# Patient Record
Sex: Male | Born: 1980 | Race: White | Hispanic: No | Marital: Married | Smoking: Never smoker
Health system: Southern US, Community
[De-identification: ages and names within clinical notes are randomized; demographics above are authoritative.]

## PROBLEM LIST (undated history)

## (undated) DIAGNOSIS — I10 Essential (primary) hypertension: Secondary | ICD-10-CM

## (undated) DIAGNOSIS — Z87898 Personal history of other specified conditions: Secondary | ICD-10-CM

## (undated) DIAGNOSIS — K219 Gastro-esophageal reflux disease without esophagitis: Secondary | ICD-10-CM

## (undated) DIAGNOSIS — R030 Elevated blood-pressure reading, without diagnosis of hypertension: Secondary | ICD-10-CM

## (undated) DIAGNOSIS — F419 Anxiety disorder, unspecified: Secondary | ICD-10-CM

## (undated) HISTORY — DX: Essential (primary) hypertension: I10

## (undated) HISTORY — DX: Personal history of other specified conditions: Z87.898

## (undated) HISTORY — DX: Anxiety disorder, unspecified: F41.9

## (undated) HISTORY — DX: Gastro-esophageal reflux disease without esophagitis: K21.9

## (undated) HISTORY — DX: Elevated blood-pressure reading, without diagnosis of hypertension: R03.0

## (undated) HISTORY — PX: NO PAST SURGERIES: SHX2092

---

## 2005-06-23 ENCOUNTER — Ambulatory Visit: Payer: Self-pay | Admitting: Urology

## 2007-09-26 IMAGING — CT CT ABD-PELV W/O CM
1 of 2 series · 16 of 32 positions shown, 20 images · non-contrast
Comparison: none

REASON FOR EXAM: left renal colic eval kidney stones
COMMENTS:

PROCEDURE:     CT  - CT ABDOMEN AND PELVIS W[DATE]  [DATE]
RESULT:
REASON FOR CONSULTATION: LEFT renal colic.
TECHNIQUE: Axial images were obtained from the hemidiaphragms to the pubic
symphysis without intravenous injection of contrast material.

[Series 2: stone · axial · 0.70mm/px · z∈[-42,+386]mm · 16 of 155 slices shown, 20 images]
[im 6/155  soft-tissue]
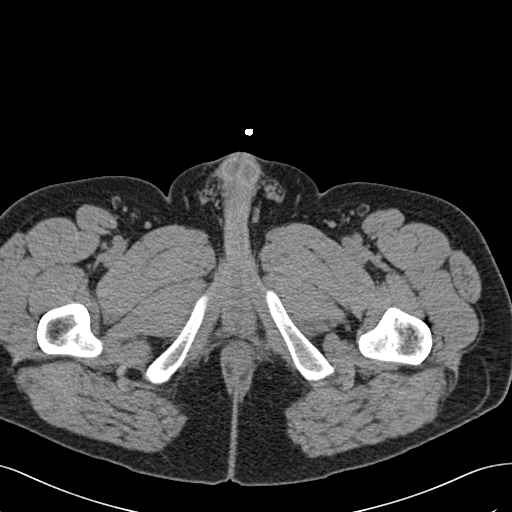
[im 6/155  bone]
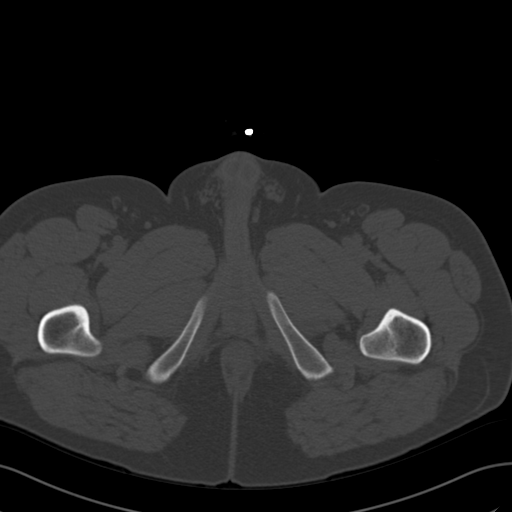
[im 17/155  soft-tissue]
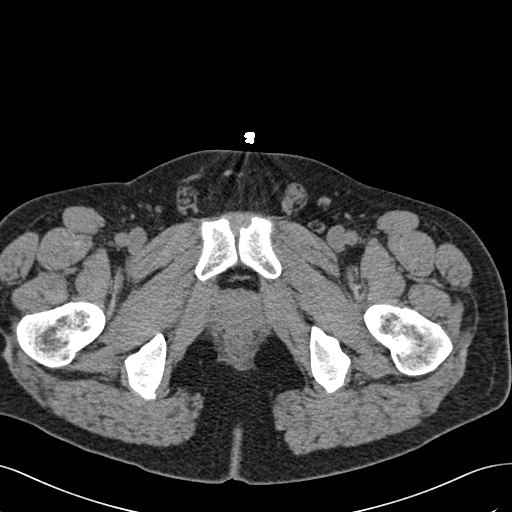
[im 28/155  soft-tissue]
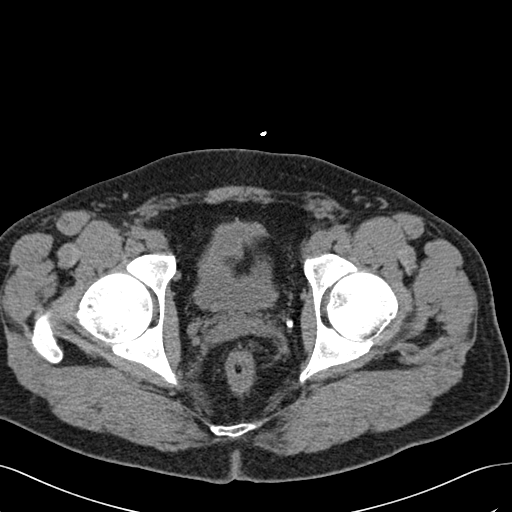
[im 39/155  soft-tissue]
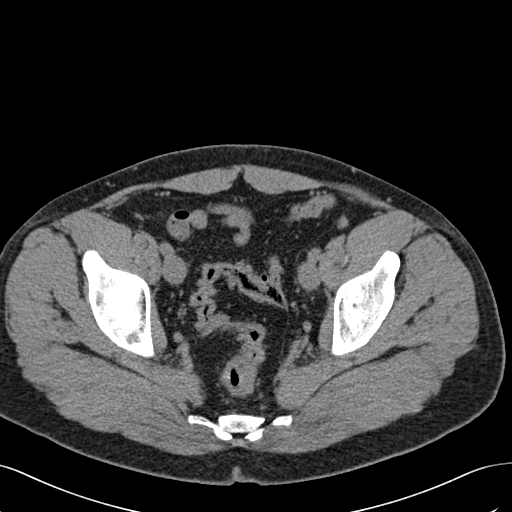
[im 50/155  soft-tissue]
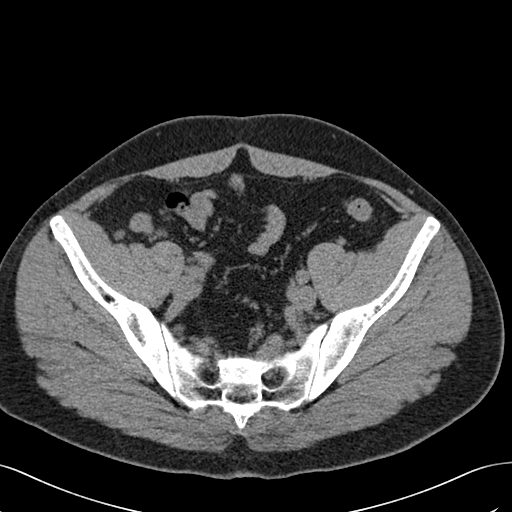
[im 61/155  soft-tissue]
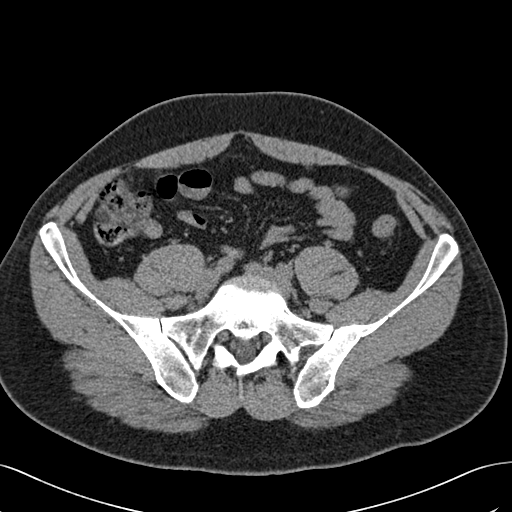
[im 72/155  soft-tissue]
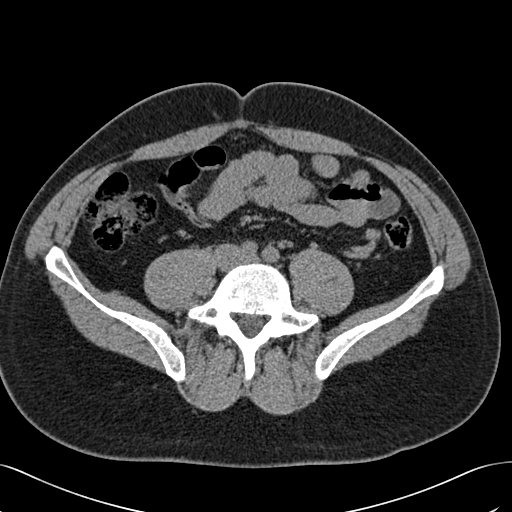
[im 83/155  soft-tissue]
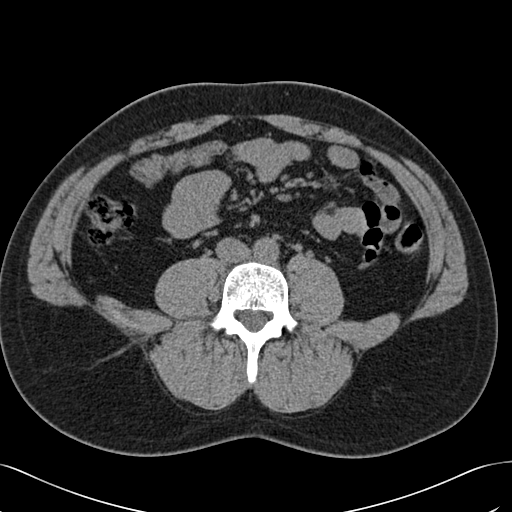
[im 94/155  soft-tissue]
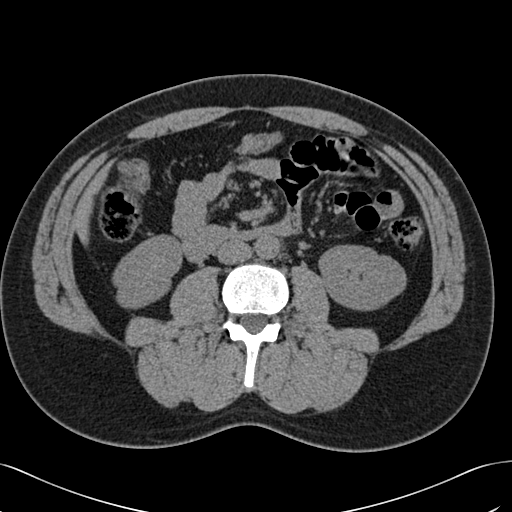
[im 94/155  bone]
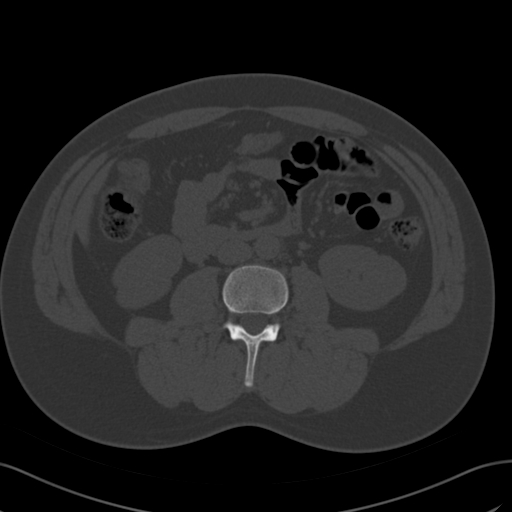
[im 105/155  soft-tissue]
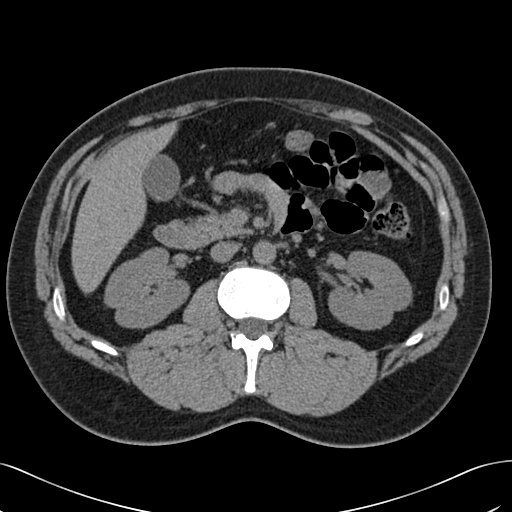
[im 116/155  soft-tissue]
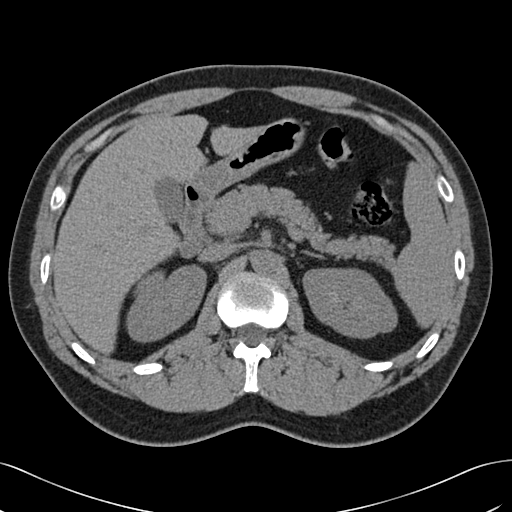
[im 127/155  soft-tissue]
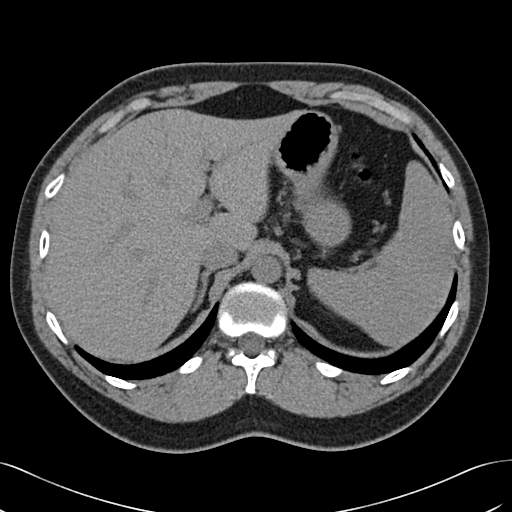
[im 133/155  lung]
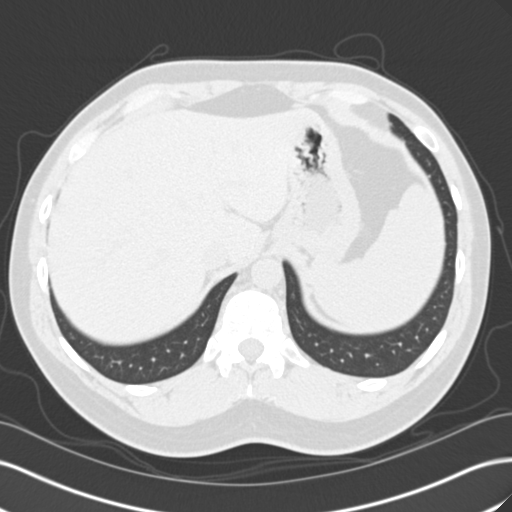
[im 138/155  soft-tissue]
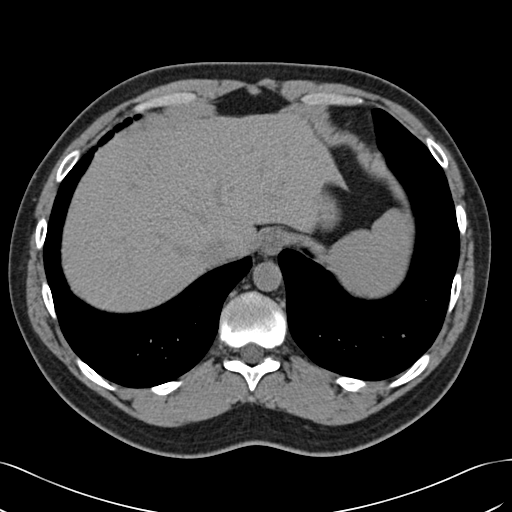
[im 138/155  lung]
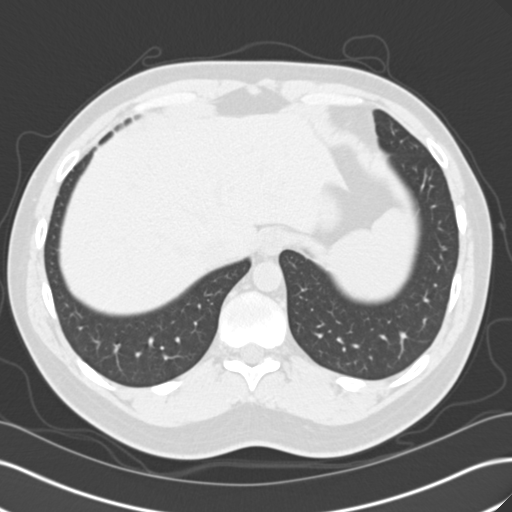
[im 144/155  lung]
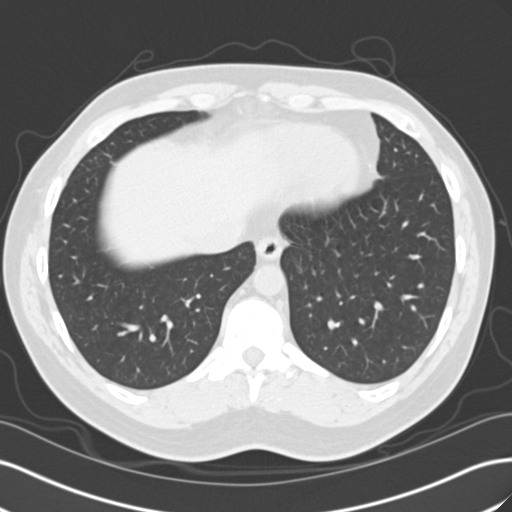
[im 149/155  soft-tissue]
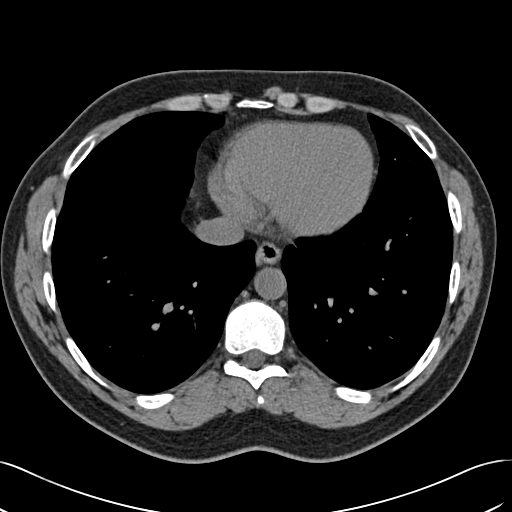
[im 149/155  lung]
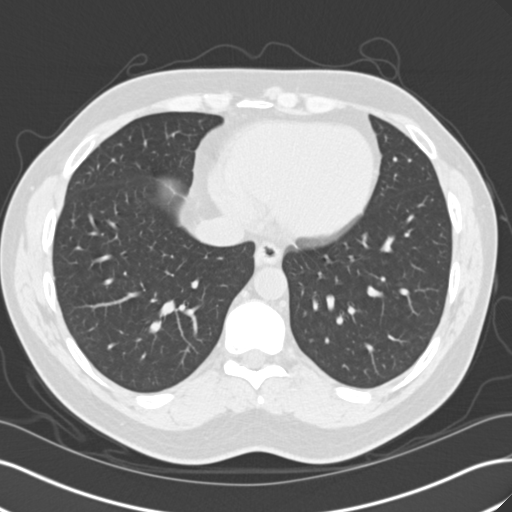

[16 of 32 positions shown; findings below may reference images not displayed]

FINDINGS: No renal or ureteral calculi are identified. No hydronephrosis is
seen.

There is noted a LEFT pelvic phlebolith present.  No fluid is noted in the
pelvis or abdomen. No evidence of diverticulitis or appendicitis is noted.
No major organ abnormalities are noted on the non-contrast study.

The images through the lung bases reveal the lung bases to be clear with no
effusions noted.
IMPRESSION: 1)No renal or ureteral calculi identified. No hydronephrosis is noted.

## 2015-03-24 ENCOUNTER — Encounter: Payer: Self-pay | Admitting: Family Medicine

## 2015-03-24 ENCOUNTER — Ambulatory Visit (INDEPENDENT_AMBULATORY_CARE_PROVIDER_SITE_OTHER): Payer: Managed Care, Other (non HMO) | Admitting: Family Medicine

## 2015-03-24 VITALS — BP 114/68 | HR 72 | Temp 98.3°F | Resp 16 | Ht 74.0 in | Wt 285.4 lb

## 2015-03-24 DIAGNOSIS — R030 Elevated blood-pressure reading, without diagnosis of hypertension: Secondary | ICD-10-CM

## 2015-03-24 DIAGNOSIS — IMO0001 Reserved for inherently not codable concepts without codable children: Secondary | ICD-10-CM | POA: Insufficient documentation

## 2015-03-24 DIAGNOSIS — Z0000 Encounter for general adult medical examination without abnormal findings: Secondary | ICD-10-CM

## 2015-03-24 NOTE — Progress Notes (Signed)
Subjective:     Patient ID: Benjamin Ritter, male   DOB: [birth year], 34 y.o.   MRN: [id number]  HPI  Chief Complaint  Patient presents with  . Annual Exam    Patient is present in office for his annual physical, patient reports that he has no questions or concerns today. Patients last recorded Tdap was within the past 7-8 years, Flu vaccine was given to patient at work in 01/2015  He will bring a foster parent form to fill out later. Continues to work for the [employer] and is "on my feet" most of the day.   Review of Systems General: Feeling well HEENT: regular dental visits. No vision changes or use of corrective lenses. Cardiovascular: no chest pain, shortness of breath, or palpitations GI: no heartburn, no change in bowel habits or blood in the stool GU: nocturia x 0, no change in bladder habits  Psychiatric: not depressed Musculoskeletal: no joint pain     Objective:   Physical Exam  Constitutional: He appears well-developed and well-nourished. No distress.  Eyes: PERRLA, Neck: no thyromegaly, tenderness or nodules,  ENT: TM's intact without inflammation; No tonsillar enlargement or exudate, Lungs: Clear Heart : RRR without murmur or gallop Abd: bowel sounds present, soft, non-tender, no organomegaly Extremities: no edema     Assessment:    1. Annual physical exam - Lipid panel - Comprehensive metabolic panel    Plan:    Further f/u pending lab work.

## 2015-03-24 NOTE — Patient Instructions (Signed)
We will call you with the lab results. 

## 2015-03-26 ENCOUNTER — Telehealth: Payer: Self-pay

## 2015-03-26 ENCOUNTER — Other Ambulatory Visit: Payer: Self-pay | Admitting: Family Medicine

## 2015-03-26 LAB — LIPID PANEL
Chol/HDL Ratio: 6.9 ratio units — ABNORMAL HIGH (ref 0.0–5.0)
Cholesterol, Total: 179 mg/dL (ref 100–199)
HDL: 26 mg/dL — ABNORMAL LOW (ref >39)
Triglycerides: 607 mg/dL (ref 0–149)

## 2015-03-26 LAB — COMPREHENSIVE METABOLIC PANEL
ALBUMIN: 4.5 g/dL (ref 3.5–5.5)
ALK PHOS: 65 IU/L (ref 39–117)
ALT: 23 IU/L (ref 0–44)
AST: 20 IU/L (ref 0–40)
Albumin/Globulin Ratio: 1.7 (ref 1.1–2.5)
BUN/Creatinine Ratio: 14 (ref 8–19)
BUN: 11 mg/dL (ref 6–20)
Bilirubin Total: <0.2 mg/dL (ref 0.0–1.2)
CALCIUM: 9 mg/dL (ref 8.7–10.2)
CO2: 22 mmol/L (ref 18–29)
CREATININE: 0.8 mg/dL (ref 0.76–1.27)
Chloride: 97 mmol/L (ref 97–106)
GFR calc Af Amer: 135 mL/min/{1.73_m2} (ref >59)
GFR, EST NON AFRICAN AMERICAN: 117 mL/min/{1.73_m2} (ref >59)
GLOBULIN, TOTAL: 2.7 g/dL (ref 1.5–4.5)
GLUCOSE: 98 mg/dL (ref 65–99)
Potassium: 4.3 mmol/L (ref 3.5–5.2)
SODIUM: 138 mmol/L (ref 136–144)
Total Protein: 7.2 g/dL (ref 6.0–8.5)

## 2015-03-26 NOTE — Telephone Encounter (Signed)
Patient has been advised of lab report, I have contacted [facility] and awaiting confirmation to see if there is enough specimen left to run lab. KW

## 2015-03-26 NOTE — Telephone Encounter (Signed)
-----   Message from Anola Gurneyobert Chauvin, [place] sent at 03/26/2015  7:48 AM EST ----- Please add direct LDL to lab draw. Let patient know we have to add another lab to calculate his LDL. Other labs are ok.

## 2015-04-17 LAB — SPECIMEN STATUS REPORT

## 2015-04-17 LAB — LDL CHOLESTEROL, DIRECT: LDL DIRECT: 75 mg/dL (ref 0–99)

## 2015-07-06 ENCOUNTER — Telehealth: Payer: Self-pay

## 2015-07-06 ENCOUNTER — Ambulatory Visit (INDEPENDENT_AMBULATORY_CARE_PROVIDER_SITE_OTHER): Payer: Managed Care, Other (non HMO) | Admitting: Family Medicine

## 2015-07-06 ENCOUNTER — Encounter: Payer: Self-pay | Admitting: Family Medicine

## 2015-07-06 VITALS — BP 128/90 | HR 109 | Temp 98.7°F | Resp 17 | Wt 289.4 lb

## 2015-07-06 DIAGNOSIS — R079 Chest pain, unspecified: Secondary | ICD-10-CM

## 2015-07-06 NOTE — Progress Notes (Signed)
Subjective:     Patient ID: Benjamin Ritter, male   DOB: 05/21/1980, 35 y.o.   MRN: [id number]  HPI  Chief Complaint  Patient presents with  . Chest Pain    Patient comes in office today accompanied by his wife with concerns of chest pain intermittent x 2 months or more. Patient reports that he has not noticed any particular activity that would cause chest pain, he described pain as in middle of chest. When patient has episodes of chest pain he complains of difficulty breathing and feeling light headed. Patient reports last episode was lasty night and it occured for an hour  States it feels like a pressure in his upper sternum which radiates down to his lower sternum. Associated with shortness of breath and occasionally lightheaded: "I lift my head and I can breathe better." Not exertion related per his report. Denies burning or use of acid blockers. Episodes last from 15 minutes to an hour (last night). Not usually associated with meals. Paternal hx of a "light" heart attack. Accompanied by his wife today. LDL of 75 on 04/17/15.   Review of Systems     Objective:   Physical Exam  Constitutional: He appears well-developed and well-nourished. No distress.  Cardiovascular: Normal rate and regular rhythm.   Pulmonary/Chest: Breath sounds normal.  Abdominal: Soft. There is no tenderness.       Assessment:    1. Chest pain, unspecified chest pain type - EKG 12-Lead - Ambulatory referral to Cardiology    Plan:    Discussed use of Dexilant 60 mg.and aspirin daily.

## 2015-07-06 NOTE — Telephone Encounter (Signed)
Patient's wife is calling saying that patient has had a discomfort in his chest for the last 2-3 weeks that comes and goes. She reports that his symptoms are very inconsistent, and it's not associated with activity. She reports that his last episode last night he had severe shortness of breath. He does become dizzy at times. However, this only last a few seconds. She reports that patient does not have history of heart disease. He does not currently take anything on a daily basis, and has not been taking anything for the pain. Appt was scheduled for today at 4:30 to be evaluated. Advised wife if symptoms worsen, he should go to the ER. She verbalized understanding.

## 2015-07-06 NOTE — Patient Instructions (Addendum)
Start Dexilant samples one daily and 81 mg.aspirin daily

## 2016-02-24 ENCOUNTER — Ambulatory Visit (INDEPENDENT_AMBULATORY_CARE_PROVIDER_SITE_OTHER): Payer: Managed Care, Other (non HMO) | Admitting: Physician Assistant

## 2016-02-24 ENCOUNTER — Encounter: Payer: Self-pay | Admitting: Physician Assistant

## 2016-02-24 VITALS — BP 126/88 | HR 96 | Temp 98.2°F | Resp 16 | Wt 288.0 lb

## 2016-02-24 DIAGNOSIS — J029 Acute pharyngitis, unspecified: Secondary | ICD-10-CM | POA: Diagnosis not present

## 2016-02-24 LAB — POCT RAPID STREP A (OFFICE): Rapid Strep A Screen: NEGATIVE

## 2016-02-24 MED ORDER — AMOXICILLIN 500 MG PO CAPS: 500.0000 mg | ORAL_CAPSULE | Freq: Two times a day (BID) | ORAL | 0 refills | Status: AC

## 2016-02-24 NOTE — Progress Notes (Signed)
Patient: Benjamin Ritter Male    DOB: 02-25-1981   35 y.o.   MRN: [id number] Visit Date: 02/24/2016  Today's Provider: Trey SailorsAdriana M Pollak, PA-C   Chief Complaint  Patient presents with  . Sore Throat    Started Saturday   Subjective:    Sore Throat   This is a new problem. The current episode started in the past 7 days. The problem has been gradually worsening. The pain is worse on the left side. Associated symptoms include congestion, coughing, headaches and trouble swallowing. Pertinent negatives include no ear discharge, ear pain, neck pain, shortness of breath or stridor. He has had exposure to strep. Exposure to: His children have strep.   Patient has three children with strep. Also having sinus pain/pressure, ear pressure.  No Known Allergies  No current outpatient prescriptions on file.  Review of Systems  Constitutional: Positive for chills and fatigue. Negative for activity change, appetite change, diaphoresis, fever and unexpected weight change.  HENT: Positive for congestion, postnasal drip, rhinorrhea, sinus pain, sinus pressure, sneezing, sore throat, trouble swallowing and voice change. Negative for ear discharge, ear pain, nosebleeds and tinnitus.   Eyes: Positive for discharge and itching. Negative for photophobia, pain, redness and visual disturbance.  Respiratory: Positive for cough. Negative for apnea, choking, chest tightness, shortness of breath, wheezing and stridor.   Cardiovascular: Negative.   Gastrointestinal: Negative.   Musculoskeletal: Negative for neck pain and neck stiffness.  Neurological: Positive for headaches. Negative for dizziness and light-headedness.    Social History  Substance Use Topics  . Smoking status: Never Smoker  . Smokeless tobacco: Never Used  . Alcohol use Yes   Objective:   BP 126/88 (BP Location: Left Arm, Patient Position: Sitting, Cuff Size: Large)   Pulse 96   Temp 98.2 F (36.8 C) (Oral)   Resp 16   Wt 288 lb  (130.6 kg)   BMI 36.98 kg/m   Physical Exam  Constitutional: He is oriented to person, place, and time. He appears well-developed and well-nourished. No distress.  HENT:  Right Ear: External ear normal.  Left Ear: External ear normal.  Nose: Nose normal.  Mouth/Throat: Uvula is midline and mucous membranes are normal. Oropharyngeal exudate, posterior oropharyngeal edema and posterior oropharyngeal erythema present.  Eyes: Conjunctivae are normal.  Cardiovascular: Normal rate and regular rhythm.   Pulmonary/Chest: Effort normal and breath sounds normal. No respiratory distress. He has no wheezes.  Lymphadenopathy:    He has cervical adenopathy.  Neurological: He is alert and oriented to person, place, and time.  Skin: Skin is warm and dry.  Psychiatric: He has a normal mood and affect. His behavior is normal.  Vitals reviewed.       Assessment & Plan:      Problem List Items Addressed This Visit    None    Visit Diagnoses    Pharyngitis, unspecified etiology    -  Primary   Relevant Medications   amoxicillin (AMOXIL) 500 MG capsule     Patient is 35 y/o presenting with pharyngitis in context of strep exposure, concerning for strep pharyngitis. Rapid strep test in office negative. Will treat as above since significant source of exposure. Patient to call back if not feeling better. Work note provided.  No Follow-up on file.  The entirety of the information documented in the History of Present Illness, Review of Systems and Physical Exam were personally obtained by me. Portions of this information were initially documented by  Kavin LeechLaura Ravinder Lukehart, CMA and reviewed by me for thoroughness and accuracy.    Patient Instructions  Strep Throat Strep throat is a bacterial infection of the throat. Your health care provider may call the infection tonsillitis or pharyngitis, depending on whether there is swelling in the tonsils or at the back of the throat. Strep throat is most common during the  cold months of the year in children who are 685-35 years of age, but it can happen during any season in people of any age. This infection is spread from person to person (contagious) through coughing, sneezing, or close contact. CAUSES Strep throat is caused by the bacteria called Streptococcus pyogenes. RISK FACTORS This condition is more likely to develop in:  People who spend time in crowded places where the infection can spread easily.  People who have close contact with someone who has strep throat. SYMPTOMS Symptoms of this condition include:  Fever or chills.   Redness, swelling, or pain in the tonsils or throat.  Pain or difficulty when swallowing.  White or yellow spots on the tonsils or throat.  Swollen, tender glands in the neck or under the jaw.  Red rash all over the body (rare). DIAGNOSIS This condition is diagnosed by performing a rapid strep test or by taking a swab of your throat (throat culture test). Results from a rapid strep test are usually ready in a few minutes, but throat culture test results are available after one or two days. TREATMENT This condition is treated with antibiotic medicine. HOME CARE INSTRUCTIONS Medicines  Take over-the-counter and prescription medicines only as told by your health care provider.  Take your antibiotic as told by your health care provider. Do not stop taking the antibiotic even if you start to feel better.  Have family members who also have a sore throat or fever tested for strep throat. They may need antibiotics if they have the strep infection. Eating and Drinking  Do not share food, drinking cups, or personal items that could cause the infection to spread to other people.  If swallowing is difficult, try eating soft foods until your sore throat feels better.  Drink enough fluid to keep your urine clear or pale yellow. General Instructions  Gargle with a salt-water mixture 3-4 times per day or as needed. To make a  salt-water mixture, completely dissolve -1 tsp of salt in 1 cup of warm water.  Make sure that all household members wash their hands well.  Get plenty of rest.  Stay home from school or work until you have been taking antibiotics for 24 hours.  Keep all follow-up visits as told by your health care provider. This is important. SEEK MEDICAL CARE IF:  The glands in your neck continue to get bigger.  You develop a rash, cough, or earache.  You cough up a thick liquid that is green, yellow-brown, or bloody.  You have pain or discomfort that does not get better with medicine.  Your problems seem to be getting worse rather than better.  You have a fever. SEEK IMMEDIATE MEDICAL CARE IF:  You have new symptoms, such as vomiting, severe headache, stiff or painful neck, chest pain, or shortness of breath.  You have severe throat pain, drooling, or changes in your voice.  You have swelling of the neck, or the skin on the neck becomes red and tender.  You have signs of dehydration, such as fatigue, dry mouth, and decreased urination.  You become increasingly sleepy, or you cannot wake  up completely.  Your joints become red or painful.   This information is not intended to replace advice given to you by your health care provider. Make sure you discuss any questions you have with your health care provider.   Document Released: 04/01/2000 Document Revised: 12/24/2014 Document Reviewed: 07/28/2014 Elsevier Interactive Patient Education 2016 [employer].           Trey Sailors, PA-C  [facility]

## 2016-02-24 NOTE — Patient Instructions (Signed)

## 2016-02-24 NOTE — Addendum Note (Signed)
Addended by: Kavin LeechWALSH, Marke Goodwyn E on: 02/24/2016 02:09 PM   Modules accepted: Orders

## 2016-05-23 ENCOUNTER — Ambulatory Visit (INDEPENDENT_AMBULATORY_CARE_PROVIDER_SITE_OTHER): Payer: Managed Care, Other (non HMO) | Admitting: Family Medicine

## 2016-05-23 ENCOUNTER — Encounter: Payer: Self-pay | Admitting: Family Medicine

## 2016-05-23 VITALS — BP 112/80 | HR 115 | Temp 99.0°F | Resp 16 | Ht 74.0 in | Wt 286.0 lb

## 2016-05-23 DIAGNOSIS — E669 Obesity, unspecified: Secondary | ICD-10-CM | POA: Diagnosis not present

## 2016-05-23 DIAGNOSIS — Z23 Encounter for immunization: Secondary | ICD-10-CM | POA: Diagnosis not present

## 2016-05-23 DIAGNOSIS — Z0000 Encounter for general adult medical examination without abnormal findings: Secondary | ICD-10-CM

## 2016-05-23 NOTE — Patient Instructions (Signed)
We will call you about the lab results and referral to nutritionist.

## 2016-05-23 NOTE — Progress Notes (Signed)
Subjective:     Patient ID: Benjamin Ritter, male   DOB: 1981-01-29, 36 y.o.   MRN: [id number]  HPI  Chief Complaint  Patient presents with  . Annual Exam    Patient comes in office today for his annual physical patient would like to adddress today his weight. Patient reports that he is trying to follow a balanced diet, patient is not actively exercising but states that he stays busy with work duties, he states that he sleeps between 6-7hrs at night. Patient declined flu vaccine and since there is no record of Tdap patient has agreed to update it today.   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 Has cut down on sodas and is trying to eat grilled not fried food. States he likes sweet tea. Attributes his weight to overeating/snacking.   Review of Systems General: Feeling well HEENT: regular dental visits, no corrective lenses Cardiovascular: no chest pain, shortness of breath, or palpitations GI: occasional heartburn for which he takes Tums;no change in bowel habits or blood in the stool GU: nocturia x 0, no change in bladder habits  Psychiatric: not depressed Musculoskeletal: no joint pain    Objective:   Physical Exam  Constitutional: He appears well-developed and well-nourished. No distress.  Eyes: PERRLA,  Neck: no thyromegaly, tenderness or nodules,no cervical adenopathy  ENT: TM's intact without inflammation; No tonsillar enlargement or exudate, Lungs: Clear Heart : RRR without murmur or gallop Abd: bowel sounds present, soft, non-tender, no organomegaly Extremities: no edema     Assessment:    1. Annual physical exam - Comprehensive metabolic panel - Lipid panel  2. Need for diphtheria-tetanus-pertussis (Tdap) vaccine - Tdap vaccine greater than or equal to 7yo IM  3. Obesity (BMI 30-39.9) - Amb ref to Medical Nutrition Therapy-MNT    Plan:    Further pending lab work Consider appetite suppressants.

## 2016-05-25 LAB — LIPID PANEL
CHOLESTEROL TOTAL: 154 mg/dL (ref 100–199)
Chol/HDL Ratio: 4.7 ratio units (ref 0.0–5.0)
HDL: 33 mg/dL — AB (ref >39)
LDL Calculated: 84 mg/dL (ref 0–99)
Triglycerides: 187 mg/dL — ABNORMAL HIGH (ref 0–149)
VLDL Cholesterol Cal: 37 mg/dL (ref 5–40)

## 2016-05-25 LAB — COMPREHENSIVE METABOLIC PANEL
A/G RATIO: 1.6 (ref 1.2–2.2)
ALK PHOS: 63 IU/L (ref 39–117)
ALT: 17 IU/L (ref 0–44)
AST: 17 IU/L (ref 0–40)
Albumin: 4.6 g/dL (ref 3.5–5.5)
BILIRUBIN TOTAL: 0.5 mg/dL (ref 0.0–1.2)
BUN/Creatinine Ratio: 16 (ref 9–20)
BUN: 11 mg/dL (ref 6–20)
CHLORIDE: 99 mmol/L (ref 96–106)
CO2: 23 mmol/L (ref 18–29)
Calcium: 9.2 mg/dL (ref 8.7–10.2)
Creatinine, Ser: 0.68 mg/dL — ABNORMAL LOW (ref 0.76–1.27)
GFR calc non Af Amer: 124 mL/min/{1.73_m2} (ref >59)
GFR, EST AFRICAN AMERICAN: 143 mL/min/{1.73_m2} (ref >59)
Globulin, Total: 2.9 g/dL (ref 1.5–4.5)
Glucose: 102 mg/dL — ABNORMAL HIGH (ref 65–99)
POTASSIUM: 4.3 mmol/L (ref 3.5–5.2)
Sodium: 139 mmol/L (ref 134–144)
Total Protein: 7.5 g/dL (ref 6.0–8.5)

## 2017-07-20 ENCOUNTER — Ambulatory Visit (INDEPENDENT_AMBULATORY_CARE_PROVIDER_SITE_OTHER): Payer: PRIVATE HEALTH INSURANCE | Admitting: Family Medicine

## 2017-07-20 ENCOUNTER — Encounter: Payer: Self-pay | Admitting: Family Medicine

## 2017-07-20 VITALS — BP 116/86 | HR 77 | Temp 98.4°F | Resp 16 | Ht 75.0 in | Wt 287.8 lb

## 2017-07-20 DIAGNOSIS — Z0000 Encounter for general adult medical examination without abnormal findings: Secondary | ICD-10-CM

## 2017-07-20 NOTE — Progress Notes (Signed)
Subjective:     Patient ID: Benjamin Ritter, male   DOB: [birth year], 37 y.o.   MRN: [id number] Chief Complaint  Patient presents with  . Annual Exam    Patient comes in office today for his annual physical in general patient states that he feels well today and has no issues to [employer]. Patient is following a well balanced diet, staying active daily with work duties, and is sleeping on average 6hrs a night. Patients last reported Tdap was 05/23/16, patient is due for screening labs today.    HPI Continues to work for the [employer]. States he walks with his family 30 minutes daily. Did not see a nutritionist last year as not covered by his insurance. Reports getting a dermatology screen last year (Dr. Gwen PoundsKowalski).  Review of Systems General: Feeling well HEENT: regular dental visits and eye exam last year (no corrective lenses) Cardiovascular: no chest pain, shortness of breath, or palpitations GI: no heartburn, no change in bowel habits or blood in the stool GU: nocturia x 0, no change in bladder habits  Psychiatric: not depressed Musculoskeletal: no joint pain    Objective:   Physical Exam  Constitutional: He appears well-developed and well-nourished. No distress.  Eyes: PERRLA, Neck: no thyromegaly, tenderness or nodules, no cervical adenopathy ENT: TM's intact without inflammation; No tonsillar enlargement or exudate, Lungs: Clear Heart : RRR without murmur or gallop Abd: bowel sounds present, soft, non-tender, no organomegaly Extremities: no edema Skin: no atypical lesions noted on his back     Assessment:    1. Annual physical exam - Comprehensive metabolic panel - Lipid panel    Plan:    Further f/u pending lab results. Encourage his walking program.

## 2017-07-20 NOTE — Patient Instructions (Signed)
We will call you with the lab results. Continue with walking program at least 30 minutes daily.

## 2017-07-21 ENCOUNTER — Encounter: Payer: Managed Care, Other (non HMO) | Admitting: Family Medicine

## 2017-07-22 LAB — COMPREHENSIVE METABOLIC PANEL
A/G RATIO: 1.5 (ref 1.2–2.2)
ALBUMIN: 4.4 g/dL (ref 3.5–5.5)
ALT: 19 IU/L (ref 0–44)
AST: 16 IU/L (ref 0–40)
Alkaline Phosphatase: 58 IU/L (ref 39–117)
BUN/Creatinine Ratio: 13 (ref 9–20)
BUN: 10 mg/dL (ref 6–20)
Bilirubin Total: 0.3 mg/dL (ref 0.0–1.2)
CALCIUM: 9 mg/dL (ref 8.7–10.2)
CO2: 21 mmol/L (ref 20–29)
Chloride: 100 mmol/L (ref 96–106)
Creatinine, Ser: 0.76 mg/dL (ref 0.76–1.27)
GFR, EST AFRICAN AMERICAN: 136 mL/min/{1.73_m2} (ref >59)
GFR, EST NON AFRICAN AMERICAN: 117 mL/min/{1.73_m2} (ref >59)
Globulin, Total: 2.9 g/dL (ref 1.5–4.5)
Glucose: 97 mg/dL (ref 65–99)
POTASSIUM: 4.5 mmol/L (ref 3.5–5.2)
Sodium: 139 mmol/L (ref 134–144)
TOTAL PROTEIN: 7.3 g/dL (ref 6.0–8.5)

## 2017-07-22 LAB — LIPID PANEL
CHOL/HDL RATIO: 5.4 ratio — AB (ref 0.0–5.0)
Cholesterol, Total: 158 mg/dL (ref 100–199)
HDL: 29 mg/dL — AB (ref >39)
LDL Calculated: 64 mg/dL (ref 0–99)
Triglycerides: 325 mg/dL — ABNORMAL HIGH (ref 0–149)
VLDL CHOLESTEROL CAL: 65 mg/dL — AB (ref 5–40)

## 2017-07-24 ENCOUNTER — Telehealth: Payer: Self-pay

## 2017-07-24 NOTE — Telephone Encounter (Signed)
Patient advised.KW 

## 2017-07-24 NOTE — Telephone Encounter (Signed)
-----   Message from Anola Gurneyobert Chauvin, [place] sent at 07/24/2017  8:07 AM EDT ----- Labs are ok. Mild elevation of triglycerides may improved with minimizing low fat food choices. Low HDL (good cholesterol) may improved with regular exercise.

## 2017-12-19 ENCOUNTER — Ambulatory Visit: Payer: PRIVATE HEALTH INSURANCE | Admitting: Family Medicine

## 2017-12-19 ENCOUNTER — Encounter: Payer: Self-pay | Admitting: Family Medicine

## 2017-12-19 VITALS — BP 112/80 | HR 84 | Temp 98.8°F | Resp 16 | Wt 285.6 lb

## 2017-12-19 DIAGNOSIS — J0120 Acute ethmoidal sinusitis, unspecified: Secondary | ICD-10-CM

## 2017-12-19 MED ORDER — AMOXICILLIN-POT CLAVULANATE 875-125 MG PO TABS: 1.0000 | ORAL_TABLET | Freq: Two times a day (BID) | ORAL | 0 refills | Status: DC

## 2017-12-19 MED ORDER — HYDROCODONE-HOMATROPINE 5-1.5 MG/5ML PO SYRP: 5.0000 mL | ORAL_SOLUTION | Freq: Four times a day (QID) | ORAL | 0 refills | Status: AC | PRN

## 2017-12-19 MED ORDER — HYDROCODONE-HOMATROPINE 5-1.5 MG/5ML PO SYRP: 5.0000 mL | ORAL_SOLUTION | Freq: Four times a day (QID) | ORAL | 0 refills | Status: DC | PRN

## 2017-12-19 NOTE — Patient Instructions (Signed)
Add Mucinex D for congestion. Let me now if sinuses are not clearing over the course of the week.

## 2017-12-19 NOTE — Progress Notes (Signed)
  Subjective:     Patient ID: Benjamin Ritter, male   DOB: 1980/11/08, 37 y.o.   MRN: [id number] Chief Complaint  Patient presents with  . Sinus Problem    Patient comes in office today with complaints of sinus pain and pressure for the past 10 days. Patient reports cough, sore throat, ear pain,shortness of breath and wheezing. Patient has taken otc Ibuprofen   HPI Patient reports increased sinus pressure, purulent sinus drainage, post nasal drainage and accompanying cough  Review of Systems     Objective:   Physical Exam  Constitutional: He appears well-developed. No distress.  Ears: T.M's intact without inflammation Sinuses: mild paranasal sinus tenderness Throat: mild tonsillar enlargement without erythema or exudate Neck: no cervical adenopathy Lungs: clear     Assessment:    1. Acute non-recurrent ethmoidal sinusitis - amoxicillin-clavulanate (AUGMENTIN) 875-125 MG tablet; Take 1 tablet by mouth 2 (two) times daily.  Dispense: 20 tablet; Refill: 0 - HYDROcodone-homatropine (HYCODAN) 5-1.5 MG/5ML syrup; Take 5 mLs by mouth every 6 (six) hours as needed for up to 5 days. 5 ml 4-6 hours as needed for cough  Dispense: 100 mL; Refill: 0    Plan:    Discussed use of Mucinex D for congestion. To call if not improving over the course of the week.

## 2018-03-20 ENCOUNTER — Encounter: Payer: Self-pay | Admitting: Podiatry

## 2018-03-20 ENCOUNTER — Other Ambulatory Visit: Payer: Self-pay | Admitting: Podiatry

## 2018-03-20 ENCOUNTER — Ambulatory Visit: Payer: PRIVATE HEALTH INSURANCE | Admitting: Podiatry

## 2018-03-20 ENCOUNTER — Ambulatory Visit (INDEPENDENT_AMBULATORY_CARE_PROVIDER_SITE_OTHER): Payer: PRIVATE HEALTH INSURANCE

## 2018-03-20 ENCOUNTER — Ambulatory Visit: Payer: PRIVATE HEALTH INSURANCE

## 2018-03-20 DIAGNOSIS — M722 Plantar fascial fibromatosis: Secondary | ICD-10-CM

## 2018-03-20 DIAGNOSIS — M7750 Other enthesopathy of unspecified foot: Secondary | ICD-10-CM

## 2018-03-20 DIAGNOSIS — M779 Enthesopathy, unspecified: Secondary | ICD-10-CM | POA: Diagnosis not present

## 2018-03-20 DIAGNOSIS — M778 Other enthesopathies, not elsewhere classified: Secondary | ICD-10-CM

## 2018-03-20 MED ORDER — MELOXICAM 15 MG PO TABS: 15.0000 mg | ORAL_TABLET | Freq: Every day | ORAL | 3 refills | Status: DC

## 2018-03-20 MED ORDER — METHYLPREDNISOLONE 4 MG PO TBPK: ORAL_TABLET | ORAL | 0 refills | Status: DC

## 2018-03-20 NOTE — Progress Notes (Signed)
  Subjective:  Patient ID: Benjamin Ritter, male    DOB: 01/25/1981,  MRN: [id number] HPI Chief Complaint  Patient presents with  . Foot Pain    Patient presents today for bilat foot pain under 5th metatarsals and heels x 2-3 months.  He states "it feels like bones rubbing together and sharp pains that come and goes.  He has been taking Ibuprofen and using Deep blue gel with  no relief    37 y.o. male presents with the above complaint.   ROS: Denies fever chills nausea vomiting muscle aches pains calf pain back pain chest pain shortness of breath.  Past Medical History:  Diagnosis Date  . H/O heartburn   . Transient elevated blood pressure    Past Surgical History:  Procedure Laterality Date  . NO PAST SURGERIES      Current Outpatient Medications:  .  ibuprofen (ADVIL,MOTRIN) 200 MG tablet, Take 200 mg by mouth every 6 (six) hours as needed., Disp: , Rfl:  .  meloxicam (MOBIC) 15 MG tablet, Take 1 tablet (15 mg total) by mouth daily., Disp: 30 tablet, Rfl: 3 .  methylPREDNISolone (MEDROL DOSEPAK) 4 MG TBPK tablet, 6 day dose pack - take as directed, Disp: 21 tablet, Rfl: 0  No Known Allergies Review of Systems Objective:  There were no vitals filed for this visit.  General: Well developed, nourished, in no acute distress, alert and oriented x3   Dermatological: Skin is warm, dry and supple bilateral. Nails x 10 are well maintained; remaining integument appears unremarkable at this time. There are no open sores, no preulcerative lesions, no rash or signs of infection present.  Vascular: Dorsalis Pedis artery and Posterior Tibial artery pedal pulses are 2/4 bilateral with immedate capillary fill time. Pedal hair growth present. No varicosities and no lower extremity edema present bilateral.   Neruologic: Grossly intact via light touch bilateral. Vibratory intact via tuning fork bilateral. Protective threshold with Semmes Wienstein monofilament intact to all pedal sites bilateral.  Patellar and Achilles deep tendon reflexes 2+ bilateral. No Babinski or clonus noted bilateral.   Musculoskeletal: No gross boney pedal deformities bilateral. No pain, crepitus, or limitation noted with foot and ankle range of motion bilateral. Muscular strength 5/5 in all groups tested bilateral.  Pain on palpation plantar to the fifth metatarsal right.  Mild tenderness on palpation of the left heel. Gait: Unassisted, Nonantalgic.    Radiographs:  Radiographs taken today demonstrate mild pes planus with soft tissue increase in density plantar fashion calcaneal insertion site of the left heel and some soft tissue swelling of the forefoot right.  No fractures identified.  Assessment & Plan:   Assessment: Capsulitis neuritis fasciitis lateral aspect right fifth met plantarly.  Plantar fasciitis left.  Plan: After sterile Betadine skin prep I injected 20 mg Kenalog 5 mg Marcaine point maximal tenderness of the fifth metatarsal of the right foot.  Tolerated procedure well states that it felt better already.  Start him on a Medrol Dosepak to be followed by meloxicam.  Discussed appropriate shoe gear stretching exercise ice therapy sugar modifications discussed possible need for orthotics.     Ballard Budney T. [place], [place]

## 2018-04-17 ENCOUNTER — Telehealth: Payer: Self-pay

## 2018-04-17 ENCOUNTER — Encounter: Payer: Self-pay | Admitting: Family Medicine

## 2018-04-17 ENCOUNTER — Ambulatory Visit (INDEPENDENT_AMBULATORY_CARE_PROVIDER_SITE_OTHER): Payer: PRIVATE HEALTH INSURANCE | Admitting: Family Medicine

## 2018-04-17 ENCOUNTER — Other Ambulatory Visit: Payer: Self-pay

## 2018-04-17 VITALS — BP 136/84 | HR 92 | Temp 98.3°F | Ht 74.0 in | Wt 289.8 lb

## 2018-04-17 DIAGNOSIS — B349 Viral infection, unspecified: Secondary | ICD-10-CM

## 2018-04-17 LAB — POC INFLUENZA A&B (BINAX/QUICKVUE)
INFLUENZA B, POC: NEGATIVE
Influenza A, POC: NEGATIVE

## 2018-04-17 NOTE — Telephone Encounter (Signed)
Patient's wife calling in this morning for an appt for Benjamin Ritter. Patient is having fever, chills and body aches. Please advise. CB# [phone]

## 2018-04-17 NOTE — Progress Notes (Signed)
  Subjective:     Patient ID: Benjamin Ritter, male   DOB: September 25, 1980, 37 y.o.   MRN: [id number] Chief Complaint  Patient presents with  . flu like symptoms    fever of 100.0 this morning, body aches, headache since yesterday afternoon 04/16/18   HPI Also reports sinus congestion and cough. + flu shot.  Review of Systems     Objective:   Physical Exam Constitutional:      General: He is not in acute distress.    Appearance: He is ill-appearing.  Neurological:     Mental Status: He is alert.   Ears: T.M's intact without inflammation Sinuses: non-tender Throat: no tonsillar enlargement or exudate Neck: no cervical adenopathy Lungs: clear     Assessment:    1. Acute viral syndrome - POC Influenza A&B(BINAX/QUICKVUE)    Plan:    Discussed use of Mucinex D for congestion, Delsym for cough, and Benadryl for postnasal drainage. Work excuse for 12/31-04/20/17

## 2018-04-17 NOTE — Telephone Encounter (Signed)
Please review

## 2018-04-17 NOTE — Telephone Encounter (Signed)
He may be put in the per doctor slot at 10:40.

## 2018-04-17 NOTE — Patient Instructions (Signed)
Discussed use of Mucinex D for congestion, Delsym for cough, and Benadryl for postnasal drainage 

## 2018-04-23 ENCOUNTER — Ambulatory Visit: Payer: PRIVATE HEALTH INSURANCE | Admitting: Podiatry

## 2018-06-15 ENCOUNTER — Encounter: Payer: Self-pay | Admitting: Family Medicine

## 2018-06-15 ENCOUNTER — Ambulatory Visit (INDEPENDENT_AMBULATORY_CARE_PROVIDER_SITE_OTHER): Payer: PRIVATE HEALTH INSURANCE | Admitting: Family Medicine

## 2018-06-15 VITALS — BP 126/86 | HR 103 | Temp 98.3°F | Resp 16 | Wt 291.8 lb

## 2018-06-15 DIAGNOSIS — Z029 Encounter for administrative examinations, unspecified: Secondary | ICD-10-CM

## 2018-06-15 NOTE — Patient Instructions (Signed)
Best wishes for the future.

## 2018-06-15 NOTE — Progress Notes (Signed)
  Subjective:     Patient ID: Benjamin Ritter, male   DOB: 1980/08/17, 38 y.o.   MRN: [id number] Chief Complaint  Patient presents with  . Medical Clearance    Patient comes in office today for clearance for adoption/foster care, patient reports that he is feeling well today but has some back pain.    HPI He is two weeks out from a workman's comp back injury but is recovering. Low risk for TB and feels well today. No chronic medical or mental health issues.  Review of Systems     Objective:   Physical Exam Constitutional:      General: He is not in acute distress.    Appearance: He is not ill-appearing.  Neurological:     Mental Status: He is alert.  Psychiatric:        Mood and Affect: Mood normal.        Behavior: Behavior normal.        Assessment:    1. Administrative encounter    Plan:    Foster care clearance form completed.

## 2019-04-01 ENCOUNTER — Encounter: Payer: Self-pay | Admitting: Podiatry

## 2019-04-01 ENCOUNTER — Ambulatory Visit: Payer: PRIVATE HEALTH INSURANCE | Admitting: Podiatry

## 2019-04-01 ENCOUNTER — Other Ambulatory Visit: Payer: Self-pay

## 2019-04-01 DIAGNOSIS — M722 Plantar fascial fibromatosis: Secondary | ICD-10-CM | POA: Diagnosis not present

## 2019-04-01 MED ORDER — METHYLPREDNISOLONE 4 MG PO TBPK: ORAL_TABLET | ORAL | 0 refills | Status: DC

## 2019-04-01 MED ORDER — MELOXICAM 15 MG PO TABS: 15.0000 mg | ORAL_TABLET | Freq: Every day | ORAL | 3 refills | Status: DC

## 2019-04-01 NOTE — Progress Notes (Signed)
He presents today as a city of [employer] with a chief complaint of heel pain to the right times the past several weeks.  States he has been doing very good with the left foot since last time I saw him.  He states that his foot is starting to bother me again like he did over here.  Denies any trauma.  Denies any changes in his past medical history medications or allergies.  ROS: Denies fever chills nausea vomiting muscle aches pains calf pain back pain chest pain shortness of breath.  Objective: Vital signs are stable alert and oriented x3 pulses are palpable.  Neurologic sensorium is intact deep tendon reflexes are intact muscle strength is normal symmetrical.  He has pain on palpation medial calcaneal tubercle of the right heel.  Assessment: Plan fasciitis right.  Plan: Discussed etiology pathology conservative surgical therapy starting bilateral Dosepak to be followed by meloxicam.  I injected her right heel today with 20 mg Kenalog 5 mg Marcaine point of maximal tenderness.  Tolerated procedure well without complications.  Started him on a Medrol Dosepak to be followed by meloxicam.  Discussed appropriate shoe gear stretching exercise ice therapy shoe gear modifications.  Follow-up with him on an as-needed basis.

## 2019-04-15 ENCOUNTER — Ambulatory Visit: Payer: PRIVATE HEALTH INSURANCE | Attending: Internal Medicine

## 2019-04-15 DIAGNOSIS — Z20822 Contact with and (suspected) exposure to covid-19: Secondary | ICD-10-CM

## 2019-04-17 LAB — NOVEL CORONAVIRUS, NAA: SARS-CoV-2, NAA: NOT DETECTED

## 2019-04-22 ENCOUNTER — Ambulatory Visit: Payer: PRIVATE HEALTH INSURANCE | Attending: Internal Medicine

## 2019-04-22 DIAGNOSIS — Z20822 Contact with and (suspected) exposure to covid-19: Secondary | ICD-10-CM

## 2019-04-23 LAB — NOVEL CORONAVIRUS, NAA: SARS-CoV-2, NAA: NOT DETECTED

## 2019-05-01 ENCOUNTER — Ambulatory Visit: Payer: PRIVATE HEALTH INSURANCE | Attending: Internal Medicine

## 2019-05-01 DIAGNOSIS — Z20822 Contact with and (suspected) exposure to covid-19: Secondary | ICD-10-CM

## 2019-05-02 ENCOUNTER — Ambulatory Visit: Payer: PRIVATE HEALTH INSURANCE | Attending: Internal Medicine

## 2019-05-02 LAB — NOVEL CORONAVIRUS, NAA: SARS-CoV-2, NAA: DETECTED — AB

## 2019-06-28 ENCOUNTER — Ambulatory Visit: Payer: PRIVATE HEALTH INSURANCE | Admitting: Podiatry

## 2019-06-28 ENCOUNTER — Other Ambulatory Visit: Payer: Self-pay

## 2019-06-28 DIAGNOSIS — M722 Plantar fascial fibromatosis: Secondary | ICD-10-CM | POA: Diagnosis not present

## 2019-06-28 MED ORDER — METHYLPREDNISOLONE 4 MG PO TBPK: ORAL_TABLET | ORAL | 0 refills | Status: DC

## 2019-07-01 NOTE — Progress Notes (Signed)
   Subjective: 39 y.o. male presenting for follow up evaluation of plantar fasciitis of the right foot. He states the injection he received at the last visit did not provide any relief. He has been taking Meloxicam and using ice therapy for treatment. He uses the plantar fascial brace as needed. Being on the foot increases the pain. Patient is here for further evaluation and treatment.   Past Medical History:  Diagnosis Date  . H/O heartburn   . Transient elevated blood pressure      Objective: Physical Exam General: The patient is alert and oriented x3 in no acute distress.  Dermatology: Skin is warm, dry and supple bilateral lower extremities. Negative for open lesions or macerations bilateral.   Vascular: Dorsalis Pedis and Posterior Tibial pulses palpable bilateral.  Capillary fill time is immediate to all digits.  Neurological: Epicritic and protective threshold intact bilateral.   Musculoskeletal: Tenderness to palpation to the plantar aspect of the right heel along the plantar fascia. All other joints range of motion within normal limits bilateral. Strength 5/5 in all groups bilateral.     Assessment: 1. Plantar fasciitis right  Plan of Care:  1. Patient evaluated.  2. Injection of 0.5cc Celestone soluspan injected into the right plantar fascia  3. Prescription for Medrol Dose Pak provided to patient. Then resume taking Meloxicam.  4. OTC Powerstep insoles provided to patient.  5. Plantar fascial brace dispensed.  6. Return to clinic in 4 weeks.   Works for the city of [place].    Felecia Shelling, DPM [facility]  Dr. Felecia Shelling, DPM    [address] [place], [place] [address]                Office [phone]  Fax [phone]

## 2019-09-24 ENCOUNTER — Telehealth: Payer: Self-pay

## 2019-09-24 NOTE — Telephone Encounter (Signed)
Copied from CRM [id number]. Topic: Appointment Scheduling - Scheduling Inquiry for Clinic >> Sep 24, 2019  1:22 PM Reggie Pile, [place] wrote: Reason for CRM: Patient's wife called in wanting to have patient see Joycelyn Man as new PCP. Patient's wife is a patient of [facility]. Please advise.

## 2019-09-26 NOTE — Telephone Encounter (Signed)
Left patient's wife a message stating Antony Contras would take this patient.   She is to call back for an appt. cbe

## 2019-09-26 NOTE — Telephone Encounter (Signed)
Sure he can establish with me. He is a patient of our office so is not a new patient, just establishing with new provider since bob left. Last seen 05/2018 by Nadine Counts.

## 2019-10-22 ENCOUNTER — Other Ambulatory Visit: Payer: Self-pay

## 2019-10-22 ENCOUNTER — Encounter: Payer: Self-pay | Admitting: Physician Assistant

## 2019-10-22 ENCOUNTER — Ambulatory Visit (INDEPENDENT_AMBULATORY_CARE_PROVIDER_SITE_OTHER): Payer: PRIVATE HEALTH INSURANCE | Admitting: Physician Assistant

## 2019-10-22 VITALS — BP 116/80 | HR 96 | Temp 98.3°F | Ht 75.0 in | Wt 289.0 lb

## 2019-10-22 DIAGNOSIS — Z0000 Encounter for general adult medical examination without abnormal findings: Secondary | ICD-10-CM

## 2019-10-22 DIAGNOSIS — E6609 Other obesity due to excess calories: Secondary | ICD-10-CM | POA: Diagnosis not present

## 2019-10-22 DIAGNOSIS — Z6836 Body mass index (BMI) 36.0-36.9, adult: Secondary | ICD-10-CM | POA: Diagnosis not present

## 2019-10-22 NOTE — Progress Notes (Signed)
I,Laura E Walsh,acting as a Neurosurgeon for [employer], PA-C.,have documented all relevant documentation on the behalf of [name], PA-C,as directed by  [name], PA-C while in the presence of [name], [place].   Complete physical exam   Patient: Benjamin Ritter   DOB: Mar 09, 1981   39 y.o. Male  MRN: [id number] Visit Date: 10/22/2019  Today's healthcare provider: [name], PA-C   Chief Complaint  Patient presents with  . Annual Exam   Subjective    Benjamin Ritter is a 38 y.o. male who presents today for a complete physical exam.  He reports consuming a general diet. The patient has a physically strenuous job, but has no regular exercise apart from work.  He generally feels well. He reports sleeping well. He does not have additional problems to discuss today.  HPI  Patient establishing with new provider. Used to see Williams, PAC.  Past Medical History:  Diagnosis Date  . H/O heartburn   . Transient elevated blood pressure    Past Surgical History:  Procedure Laterality Date  . NO PAST SURGERIES     Social History   Socioeconomic History  . Marital status: Married    Spouse name: Not on file  . Number of children: Not on file  . Years of education: Not on file  . Highest education level: Not on file  Occupational History  . Not on file  Tobacco Use  . Smoking status: Never Smoker  . Smokeless tobacco: Never Used  Substance and Sexual Activity  . Alcohol use: Yes  . Drug use: No  . Sexual activity: Yes  Other Topics Concern  . Not on file  Social History Narrative  . Not on file   Social Determinants of Health   Financial Resource Strain:   . Difficulty of Paying Living Expenses:   Food Insecurity:   . Worried About Programme researcher, broadcasting/film/video in the Last Year:   . Barista in the Last Year:   Transportation Needs:   . Freight forwarder (Medical):   Marland Kitchen Lack of Transportation (Non-Medical):   Physical  Activity:   . Days of Exercise per Week:   . Minutes of Exercise per Session:   Stress:   . Feeling of Stress :   Social Connections:   . Frequency of Communication with Friends and Family:   . Frequency of Social Gatherings with Friends and Family:   . Attends Religious Services:   . Active Member of Clubs or Organizations:   . Attends Banker Meetings:   Marland Kitchen Marital Status:   Intimate Partner Violence:   . Fear of Current or Ex-Partner:   . Emotionally Abused:   Marland Kitchen Physically Abused:   . Sexually Abused:    Family Status  Relation Name Status  . Father  Alive  . Sister  Alive  . Brother  Alive  . Daughter  Alive  . MGM  Deceased  . PGM  Deceased  . Brother  Alive  . Brother  Alive  . Brother  Alive  . Brother  Alive  . Brother  Alive  . Brother  Alive  . Brother  Alive  . Sister  Alive  . Sister  Alive  . Mother  Alive  . PGF  Deceased  . MGF  Deceased   Family History  Problem Relation Age of Onset  . Diabetes Father   . Healthy Sister   .  Healthy Brother   . ADD / ADHD Daughter   . Cancer Maternal Grandmother        lung  . Cancer Paternal Grandmother   . Healthy Brother   . Healthy Brother   . Healthy Brother   . Healthy Brother   . Healthy Brother   . Healthy Brother   . Alcohol abuse Brother   . Healthy Sister   . Healthy Sister    No Known Allergies  Patient Care Team: Margaretann LovelessBurnette, Inaaya Vellucci M, PA-C as PCP - General (Family Medicine)   Medications: Outpatient Medications Prior to Visit  Medication Sig  . ibuprofen (ADVIL,MOTRIN) 800 MG tablet Take 800 mg by mouth every 8 (eight) hours as needed.  . meloxicam (MOBIC) 15 MG tablet Take 1 tablet (15 mg total) by mouth daily.  . methylPREDNISolone (MEDROL DOSEPAK) 4 MG TBPK tablet 6 day dose pack - take as directed   No facility-administered medications prior to visit.    Review of Systems  Constitutional: Negative.   HENT: Negative.   Eyes: Negative.   Respiratory: Negative.     Cardiovascular: Negative.   Gastrointestinal: Negative.   Endocrine: Negative.   Genitourinary: Negative.   Musculoskeletal: Negative.   Skin: Negative.   Allergic/Immunologic: Negative.   Neurological: Negative.   Hematological: Negative.   Psychiatric/Behavioral: Negative.     Last CBC No results found for: WBC, HGB, HCT, MCV, MCH, RDW, PLT Last metabolic panel Lab Results  Component Value Date   GLUCOSE 97 07/21/2017   NA 139 07/21/2017   K 4.5 07/21/2017   CL 100 07/21/2017   CO2 21 07/21/2017   BUN 10 07/21/2017   CREATININE 0.76 07/21/2017   GFRNONAA 117 07/21/2017   GFRAA 136 07/21/2017   CALCIUM 9.0 07/21/2017   PROT 7.3 07/21/2017   ALBUMIN 4.4 07/21/2017   LABGLOB 2.9 07/21/2017   AGRATIO 1.5 07/21/2017   BILITOT 0.3 07/21/2017   ALKPHOS 58 07/21/2017   AST 16 07/21/2017   ALT 19 07/21/2017   Last lipids Lab Results  Component Value Date   CHOL 158 07/21/2017   HDL 29 (L) 07/21/2017   LDLCALC 64 07/21/2017   LDLDIRECT 75 03/24/2015   TRIG 325 (H) 07/21/2017   CHOLHDL 5.4 (H) 07/21/2017   Last hemoglobin A1c No results found for: HGBA1C Last thyroid functions No results found for: TSH, T3TOTAL, T4TOTAL, THYROIDAB  Objective    BP 116/80 (BP Location: Right Arm, Patient Position: Sitting, Cuff Size: Large)   Pulse 96   Temp 98.3 F (36.8 C) (Oral)   Ht 6\' 3"  (1.905 m)   Wt 289 lb (131.1 kg)   SpO2 96%   BMI 36.12 kg/m    Physical Exam Constitutional:      General: He is not in acute distress.    Appearance: Normal appearance. He is obese. He is not ill-appearing.  HENT:     Head: Normocephalic and atraumatic.     Right Ear: Tympanic membrane, ear canal and external ear normal.     Left Ear: Tympanic membrane, ear canal and external ear normal.     Nose: Nose normal.     Mouth/Throat:     Mouth: Mucous membranes are dry.     Pharynx: Oropharynx is clear.  Eyes:     General: No scleral icterus.    Extraocular Movements: Extraocular  movements intact.     Conjunctiva/sclera: Conjunctivae normal.     Pupils: Pupils are equal, round, and reactive to light.  Cardiovascular:  Rate and Rhythm: Normal rate and regular rhythm.     Pulses: Normal pulses.          Dorsalis pedis pulses are 2+ on the right side and 2+ on the left side.       Posterior tibial pulses are 2+ on the right side and 2+ on the left side.     Heart sounds: Normal heart sounds. No murmur heard.   Pulmonary:     Effort: Pulmonary effort is normal.     Breath sounds: Normal breath sounds. No wheezing.  Abdominal:     General: Abdomen is flat. Bowel sounds are normal. There is no abdominal bruit.     Palpations: Abdomen is soft. There is no mass.     Tenderness: There is no abdominal tenderness.     Hernia: No hernia is present.  Musculoskeletal:        General: Normal range of motion.     Cervical back: Normal range of motion and neck supple.     Right lower leg: No edema.     Left lower leg: No edema.  Lymphadenopathy:     Cervical: No cervical adenopathy.  Skin:    General: Skin is warm and dry.     Capillary Refill: Capillary refill takes less than 2 seconds.  Neurological:     General: No focal deficit present.     Mental Status: He is alert and oriented to person, place, and time. Mental status is at baseline.  Psychiatric:        Mood and Affect: Mood normal.        Behavior: Behavior normal.        Thought Content: Thought content normal.        Judgment: Judgment normal.       Last depression screening scores PHQ 2/9 Scores 10/22/2019 07/20/2017 05/23/2016  PHQ - 2 Score 0 0 0  PHQ- 9 Score 0 - -   Last fall risk screening Fall Risk  10/22/2019  Falls in the past year? 0  Number falls in past yr: 0  Injury with Fall? 0  Follow up Falls evaluation completed   Last Audit-C alcohol use screening Alcohol Use Disorder Test (AUDIT) 10/22/2019  1. How often do you have a drink containing alcohol? 1  2. How many drinks containing  alcohol do you have on a typical day when you are drinking? 0  3. How often do you have six or more drinks on one occasion? 1  AUDIT-C Score 2  Alcohol Brief Interventions/Follow-up AUDIT Score <7 follow-up not indicated   A score of 3 or more in women, and 4 or more in men indicates increased risk for alcohol abuse, EXCEPT if all of the points are from question 1   No results found for any visits on 10/22/19.  Assessment & Plan    Routine Health Maintenance and Physical Exam  Exercise Activities and Dietary recommendations Goals   None     Immunization History  Administered Date(s) Administered  . Influenza Inj Mdck Quad Pf 02/03/2017  . Influenza Inj Mdck Quad With Preservative 01/31/2018  . Influenza-Unspecified 01/17/2015, 03/20/2018, 02/19/2019  . Tdap 05/23/2016    Health Maintenance  Topic Date Due  . Hepatitis C Screening  Never done  . COVID-19 Vaccine (1) Never done  . HIV Screening  Never done  . INFLUENZA VACCINE  11/17/2019  . TETANUS/TDAP  05/23/2026    Discussed health benefits of physical activity, and encouraged him to engage  in regular exercise appropriate for his age and condition.  Problem List Items Addressed This Visit      Other   Obese    Discussed importance of healthy weight management Discussed diet and exercise       Annual physical exam - Primary    Normal physical exam today. Will check labs as below and f/u pending lab results. If labs are stable and WNL he will not need to have these rechecked for one year at his next annual physical exam. He is to call the office in the meantime if he has any acute issue, questions or concerns.      Relevant Orders   CBC with Differential/Platelet   Lipid panel   TSH   Comprehensive metabolic panel      Return in about 1 year (around 10/21/2020).     Delmer Islam, PA-C, have reviewed all documentation for this visit. The documentation on 10/22/19 for the exam, diagnosis, procedures,  and orders are all accurate and complete.   Reine Just  [facility] [phone] (phone) [phone] (fax)  [facility]

## 2019-10-22 NOTE — Assessment & Plan Note (Signed)
Discussed importance of healthy weight management Discussed diet and exercise  

## 2019-10-22 NOTE — Assessment & Plan Note (Signed)
Normal physical exam today. Will check labs as below and f/u pending lab results. If labs are stable and WNL he will not need to have these rechecked for one year at his next annual physical exam. He is to call the office in the meantime if he has any acute issue, questions or concerns. 

## 2019-10-22 NOTE — Patient Instructions (Signed)
Preventive Care 19-39 Years Old, Male Preventive care refers to lifestyle choices and visits with your health care provider that can promote health and wellness. This includes:  A yearly physical exam. This is also called an annual well check.  Regular dental and eye exams.  Immunizations.  Screening for certain conditions.  Healthy lifestyle choices, such as eating a healthy diet, getting regular exercise, not using drugs or products that contain nicotine and tobacco, and limiting alcohol use. What can I expect for my preventive care visit? Physical exam Your health care provider will check:  Height and weight. These may be used to calculate body mass index (BMI), which is a measurement that tells if you are at a healthy weight.  Heart rate and blood pressure.  Your skin for abnormal spots. Counseling Your health care provider may ask you questions about:  Alcohol, tobacco, and drug use.  Emotional well-being.  Home and relationship well-being.  Sexual activity.  Eating habits.  Work and work Statistician. What immunizations do I need?  Influenza (flu) vaccine  This is recommended every year. Tetanus, diphtheria, and pertussis (Tdap) vaccine  You may need a Td booster every 10 years. Varicella (chickenpox) vaccine  You may need this vaccine if you have not already been vaccinated. Human papillomavirus (HPV) vaccine  If recommended by your health care provider, you may need three doses over 6 months. Measles, mumps, and rubella (MMR) vaccine  You may need at least one dose of MMR. You may also need a second dose. Meningococcal conjugate (MenACWY) vaccine  One dose is recommended if you are 45-76 years old and a Market researcher living in a residence hall, or if you have one of several medical conditions. You may also need additional booster doses. Pneumococcal conjugate (PCV13) vaccine  You may need this if you have certain conditions and were not  previously vaccinated. Pneumococcal polysaccharide (PPSV23) vaccine  You may need one or two doses if you smoke cigarettes or if you have certain conditions. Hepatitis A vaccine  You may need this if you have certain conditions or if you travel or work in places where you may be exposed to hepatitis A. Hepatitis B vaccine  You may need this if you have certain conditions or if you travel or work in places where you may be exposed to hepatitis B. Haemophilus influenzae type b (Hib) vaccine  You may need this if you have certain risk factors. You may receive vaccines as individual doses or as more than one vaccine together in one shot (combination vaccines). Talk with your health care provider about the risks and benefits of combination vaccines. What tests do I need? Blood tests  Lipid and cholesterol levels. These may be checked every 5 years starting at age 17.  Hepatitis C test.  Hepatitis B test. Screening   Diabetes screening. This is done by checking your blood sugar (glucose) after you have not eaten for a while (fasting).  Sexually transmitted disease (STD) testing. Talk with your health care provider about your test results, treatment options, and if necessary, the need for more tests. Follow these instructions at home: Eating and drinking   Eat a diet that includes fresh fruits and vegetables, whole grains, lean protein, and low-fat dairy products.  Take vitamin and mineral supplements as recommended by your health care provider.  Do not drink alcohol if your health care provider tells you not to drink.  If you drink alcohol: ? Limit how much you have to 0-2  drinks a day. ? Be aware of how much alcohol is in your drink. In the [place] one drink equals one 12 oz bottle of beer (355 mL), one 5 oz glass of wine (148 mL), or one 1 oz glass of hard liquor (44 mL). Lifestyle  Take daily care of your teeth and gums.  Stay active. Exercise for at least 30 minutes on 5 or  more days each week.  Do not use any products that contain nicotine or tobacco, such as cigarettes, e-cigarettes, and chewing tobacco. If you need help quitting, ask your health care provider.  If you are sexually active, practice safe sex. Use a condom or other form of protection to prevent STIs (sexually transmitted infections). What's next?  Go to your health care provider once a year for a well check visit.  Ask your health care provider how often you should have your eyes and teeth checked.  Stay up to date on all vaccines. This information is not intended to replace advice given to you by your health care provider. Make sure you discuss any questions you have with your health care provider. Document Revised: 03/29/2018 Document Reviewed: 03/29/2018 Elsevier Patient Education  2020 [employer].

## 2019-10-26 LAB — COMPREHENSIVE METABOLIC PANEL
ALT: 16 IU/L (ref 0–44)
AST: 16 IU/L (ref 0–40)
Albumin/Globulin Ratio: 1.7 (ref 1.2–2.2)
Albumin: 4.5 g/dL (ref 4.0–5.0)
Alkaline Phosphatase: 61 IU/L (ref 48–121)
BUN/Creatinine Ratio: 16 (ref 9–20)
BUN: 12 mg/dL (ref 6–20)
Bilirubin Total: 0.5 mg/dL (ref 0.0–1.2)
CO2: 25 mmol/L (ref 20–29)
Calcium: 9.1 mg/dL (ref 8.7–10.2)
Chloride: 98 mmol/L (ref 96–106)
Creatinine, Ser: 0.75 mg/dL — ABNORMAL LOW (ref 0.76–1.27)
GFR calc Af Amer: 134 mL/min/{1.73_m2} (ref >59)
GFR calc non Af Amer: 116 mL/min/{1.73_m2} (ref >59)
Globulin, Total: 2.6 g/dL (ref 1.5–4.5)
Glucose: 92 mg/dL (ref 65–99)
Potassium: 4.2 mmol/L (ref 3.5–5.2)
Sodium: 136 mmol/L (ref 134–144)
Total Protein: 7.1 g/dL (ref 6.0–8.5)

## 2019-10-26 LAB — CBC WITH DIFFERENTIAL/PLATELET
Basophils Absolute: 0.1 10*3/uL (ref 0.0–0.2)
Basos: 1 %
EOS (ABSOLUTE): 0.2 10*3/uL (ref 0.0–0.4)
Eos: 3 %
Hematocrit: 44.4 % (ref 37.5–51.0)
Hemoglobin: 14.5 g/dL (ref 13.0–17.7)
Immature Grans (Abs): 0 10*3/uL (ref 0.0–0.1)
Immature Granulocytes: 0 %
Lymphocytes Absolute: 2.9 10*3/uL (ref 0.7–3.1)
Lymphs: 45 %
MCH: 27.8 pg (ref 26.6–33.0)
MCHC: 32.7 g/dL (ref 31.5–35.7)
MCV: 85 fL (ref 79–97)
Monocytes Absolute: 0.6 10*3/uL (ref 0.1–0.9)
Monocytes: 9 %
Neutrophils Absolute: 2.7 10*3/uL (ref 1.4–7.0)
Neutrophils: 42 %
Platelets: 221 10*3/uL (ref 150–450)
RBC: 5.22 x10E6/uL (ref 4.14–5.80)
RDW: 13.1 % (ref 11.6–15.4)
WBC: 6.5 10*3/uL (ref 3.4–10.8)

## 2019-10-26 LAB — LIPID PANEL
Chol/HDL Ratio: 5.5 ratio — ABNORMAL HIGH (ref 0.0–5.0)
Cholesterol, Total: 160 mg/dL (ref 100–199)
HDL: 29 mg/dL — ABNORMAL LOW (ref >39)
LDL Chol Calc (NIH): 83 mg/dL (ref 0–99)
Triglycerides: 290 mg/dL — ABNORMAL HIGH (ref 0–149)
VLDL Cholesterol Cal: 48 mg/dL — ABNORMAL HIGH (ref 5–40)

## 2019-10-26 LAB — TSH: TSH: 1.37 u[IU]/mL (ref 0.450–4.500)

## 2019-10-28 ENCOUNTER — Telehealth: Payer: Self-pay

## 2019-10-28 NOTE — Telephone Encounter (Signed)
-----   Message from [name], PA-C sent at 10/28/2019  7:56 AM EDT ----- Blood count is normal. Cholesterol is ok. Triglycerides are elevated. This could be if lab was not done fasting. This is also portion of cholesterol that is most closely related to diet habits. Limiting fatty foods, red meats, processed foods and sugars can help. Thyroid is normal. Kidney and liver function are normal. Sodium, potassium, and calcium are normal.

## 2019-10-28 NOTE — Telephone Encounter (Signed)
Patient advised as directed below. Patient states that this was with fasting.

## 2019-11-20 ENCOUNTER — Encounter: Payer: Self-pay | Admitting: Physician Assistant

## 2020-01-02 ENCOUNTER — Ambulatory Visit: Payer: Self-pay | Admitting: Physician Assistant

## 2020-01-02 ENCOUNTER — Telehealth: Payer: Self-pay

## 2020-01-02 NOTE — Telephone Encounter (Signed)
Pt reports dizziness, nausea with vomiting, diaphoresis x 2 months. States episodes occur "Every other week." States dizziness "Like floating, not spinning then I throw up and stay nauseated all day and a little bit dizzy." States resolves until next episode. No new meds or medication changes, no changes in diet, staying hydrated. "I can't pin point it to anything." Denies any abdominal pain. States when occurs has to hold on to things to walk, "Until I throw up." No known covid exposure, fully vaccinated. NT called practice, unable to schedule OV with covid screening/ dizziness. Rosey Bath advised route summary HP for PCPs review. Pt aware , care advise given, advised ED if symptoms worsen, sudden headache, unilateral weakness. Pt verbalizes understanding.  CB# [id number]  Reason for Disposition . [1] MODERATE dizziness (e.g., interferes with normal activities) AND [2] has NOT been evaluated by physician for this  (Exception: dizziness caused by heat exposure, sudden standing, or poor fluid intake)  Answer Assessment - Initial Assessment Questions 1. DESCRIPTION: "Describe your dizziness."     "Floating" 2. LIGHTHEADED: "Do you feel lightheaded?" (e.g., somewhat faint, woozy,  yes 3. VERTIGO: "Do you feel like either you or the room is spinning or tilting?" (i.e. vertigo)     no 4. SEVERITY: "How bad is it?"  "Do you feel like you are going to faint?" "Can you stand and walk?"   - MILD: Feels slightly dizzy, but walking normally.   - MODERATE: Feels very unsteady when walking, but not falling; interferes with normal activities (e.g., school, work) .   - SEVERE: Unable to walk without falling, or requires assistance to walk without falling; feels like passing out now.      moderate 5. ONSET:  "When did the dizziness begin?"     2 months ago 6. AGGRAVATING FACTORS: "Does anything make it worse?" (e.g., standing, change in head position)     "Just comes on randomly." 7. HEART RATE: "Can you tell me  your heart rate?" "How many beats in 15 seconds?"  (Note: not all patients can do this)       NA 8. CAUSE: "What do you think is causing the dizziness?"     Unsure 9. RECURRENT SYMPTOM: "Have you had dizziness before?" If Yes, ask: "When was the last time?" "What happened that time?"     no 10. OTHER SYMPTOMS: "Do you have any other symptoms?" (e.g., fever, chest pain, vomiting, diarrhea, bleeding)       Nausea and vomiting "Every other week"  Protocols used: DIZZINESS [facility]

## 2020-01-02 NOTE — Telephone Encounter (Signed)
Ok to schedule.

## 2020-01-02 NOTE — Telephone Encounter (Signed)
Called pt to schedule OV. Offered pt 6 pm appt. Pt stated that was too late in the day and he feels awful. I went ahead and scheduled pt in the 11 am slot. TNP

## 2020-01-02 NOTE — Telephone Encounter (Signed)
Copied from CRM [id number]. Topic: General - Other >> Jan 02, 2020  2:56 PM Elliot Gault wrote: Patient spouse would like a in office appointment to discuss vertigo, as per patient vertigo is causing the patient to feel nausea and exp. chills. Patient is fully vaccinated and received his booster.   Spouse would like a call as soon as possible to schedule in office appointment advised spouse.

## 2020-01-03 ENCOUNTER — Other Ambulatory Visit: Payer: Self-pay

## 2020-01-03 ENCOUNTER — Encounter: Payer: Self-pay | Admitting: Physician Assistant

## 2020-01-03 ENCOUNTER — Ambulatory Visit: Payer: PRIVATE HEALTH INSURANCE | Admitting: Physician Assistant

## 2020-01-03 DIAGNOSIS — K219 Gastro-esophageal reflux disease without esophagitis: Secondary | ICD-10-CM

## 2020-01-03 DIAGNOSIS — I10 Essential (primary) hypertension: Secondary | ICD-10-CM

## 2020-01-03 DIAGNOSIS — R42 Dizziness and giddiness: Secondary | ICD-10-CM

## 2020-01-03 MED ORDER — TRIAMTERENE-HCTZ 37.5-25 MG PO TABS: 1.0000 | ORAL_TABLET | Freq: Every day | ORAL | 3 refills | Status: DC

## 2020-01-03 MED ORDER — PANTOPRAZOLE SODIUM 40 MG PO TBEC: 40.0000 mg | DELAYED_RELEASE_TABLET | Freq: Two times a day (BID) | ORAL | 0 refills | Status: DC

## 2020-01-03 MED ORDER — TRIAMTERENE-HCTZ 37.5-25 MG PO CAPS: 1.0000 | ORAL_CAPSULE | Freq: Every day | ORAL | 0 refills | Status: DC

## 2020-01-03 NOTE — Patient Instructions (Signed)

## 2020-01-03 NOTE — Progress Notes (Signed)
Established patient visit   Patient: Benjamin Ritter   DOB: December 07, 1980   39 y.o. Male  MRN: [id number] Visit Date: 01/03/2020  Today's healthcare provider: [name], PA-C   Chief Complaint  Patient presents with  . Dizziness   Subjective    Dizziness This is a new (off and on 2 months and now happening every two weeks) problem. The problem occurs every several days. The problem has been gradually worsening. Associated symptoms include diaphoresis, fatigue, nausea, vertigo and vomiting. Pertinent negatives include no congestion or headaches. Associated symptoms comments: When he has vertigo. Nothing aggravates the symptoms. He has tried sleep and rest for the symptoms.    Reports symptoms of vertigo started in [place] on vacation in July. Had one episode there where he just had to go lie down and rest. Has since had 3 more episodes, normally about 2-3 weeks apart. Vertigo symptoms feel like "everything is spinning, me and the room". Symptoms can last for hours and are normally resolved with rest. Can feel "off balance" the next morning. Has associated nausea, vomiting, sweating, and neck stiffness. Denies hearing loss, tinnitus.    Acid Reflux: he gets this 3-4 times a week. He gets the burning, it happens ramdon.He has tried Omeprazole and TUMS. Patient has been taking meloxicam for his foot and IBU he used to take more regularly before the meloxicam and now only uses when pain is more severe.    Patient Active Problem List   Diagnosis Date Noted  . Obese 10/22/2019  . Annual physical exam 10/22/2019   Past Medical History:  Diagnosis Date  . H/O heartburn   . Transient elevated blood pressure        Medications: Outpatient Medications Prior to Visit  Medication Sig  . ibuprofen (ADVIL,MOTRIN) 800 MG tablet Take 800 mg by mouth every 8 (eight) hours as needed.  . meloxicam (MOBIC) 15 MG tablet Take 1 tablet (15 mg total) by mouth daily.   No  facility-administered medications prior to visit.    Review of Systems  Constitutional: Positive for diaphoresis and fatigue.  HENT: Negative for congestion, ear pain, hearing loss, postnasal drip, rhinorrhea, sinus pressure, sinus pain, sneezing and tinnitus.   Respiratory: Negative.   Cardiovascular: Negative.   Gastrointestinal: Positive for nausea and vomiting.       Gerd  Musculoskeletal: Positive for neck stiffness.  Neurological: Positive for dizziness and vertigo. Negative for headaches.    Last CBC Lab Results  Component Value Date   WBC 6.5 10/25/2019   HGB 14.5 10/25/2019   HCT 44.4 10/25/2019   MCV 85 10/25/2019   MCH 27.8 10/25/2019   RDW 13.1 10/25/2019   PLT 221 10/25/2019   Last metabolic panel Lab Results  Component Value Date   GLUCOSE 92 10/25/2019   NA 136 10/25/2019   K 4.2 10/25/2019   CL 98 10/25/2019   CO2 25 10/25/2019   BUN 12 10/25/2019   CREATININE 0.75 (L) 10/25/2019   GFRNONAA 116 10/25/2019   GFRAA 134 10/25/2019   CALCIUM 9.1 10/25/2019   PROT 7.1 10/25/2019   ALBUMIN 4.5 10/25/2019   LABGLOB 2.6 10/25/2019   AGRATIO 1.7 10/25/2019   BILITOT 0.5 10/25/2019   ALKPHOS 61 10/25/2019   AST 16 10/25/2019   ALT 16 10/25/2019      Objective    BP (!) 141/88 (BP Location: Right Arm, Patient Position: Sitting, Cuff Size: Large)   Pulse 78   Temp 98.3 F (  36.8 C) (Oral)   Resp 16   Ht 6\' 3"  (1.905 m)   Wt 281 lb (127.5 kg)   BMI 35.12 kg/m    Orthostatic VS for the past 24 hrs (Last 3 readings):  BP- Lying Pulse- Lying BP- Sitting Pulse- Sitting BP- Standing at 0 minutes Pulse- Standing at 0 minutes  01/03/20 1133 132/81 66 135/79 95 129/83 77   BP Readings from Last 3 Encounters:  01/03/20 (!) 141/88  10/22/19 116/80  06/15/18 126/86   Wt Readings from Last 3 Encounters:  01/03/20 281 lb (127.5 kg)  10/22/19 289 lb (131.1 kg)  06/15/18 291 lb 12.8 oz (132.4 kg)      Physical Exam Vitals reviewed.  Constitutional:       General: He is not in acute distress.    Appearance: Normal appearance. He is well-developed. He is obese. He is not ill-appearing or diaphoretic.  HENT:     Head: Normocephalic and atraumatic.     Right Ear: Hearing, tympanic membrane, ear canal and external ear normal. No middle ear effusion. Tympanic membrane is not erythematous or bulging.     Left Ear: Hearing, tympanic membrane, ear canal and external ear normal.  No middle ear effusion. Tympanic membrane is not erythematous or bulging.     Nose: No rhinorrhea.     Right Sinus: No maxillary sinus tenderness or frontal sinus tenderness.     Left Sinus: No maxillary sinus tenderness or frontal sinus tenderness.     Mouth/Throat:     Lips: Pink.     Mouth: Mucous membranes are moist.     Pharynx: Oropharynx is clear. Uvula midline. No oropharyngeal exudate or posterior oropharyngeal erythema.  Eyes:     General: No scleral icterus.       Right eye: No discharge.        Left eye: No discharge.     Extraocular Movements: Extraocular movements intact.     Right eye: No nystagmus.     Left eye: No nystagmus.     Conjunctiva/sclera: Conjunctivae normal.     Pupils: Pupils are equal, round, and reactive to light.  Neck:     Thyroid: No thyromegaly.     Vascular: No JVD.     Trachea: No tracheal deviation.     Meningeal: Brudzinski's sign and Kernig's sign absent.  Cardiovascular:     Rate and Rhythm: Normal rate and regular rhythm.     Pulses: Normal pulses.     Heart sounds: Normal heart sounds. No murmur heard.  No friction rub. No gallop.   Pulmonary:     Effort: Pulmonary effort is normal. No respiratory distress.     Breath sounds: Normal breath sounds. No stridor. No wheezing or rales.  Musculoskeletal:     Cervical back: Normal range of motion and neck supple.     Right lower leg: No edema.     Left lower leg: No edema.  Lymphadenopathy:     Cervical: No cervical adenopathy.  Skin:    General: Skin is warm and dry.    Neurological:     General: No focal deficit present.     Mental Status: He is alert. Mental status is at baseline.     Cranial Nerves: No cranial nerve deficit.     Sensory: No sensory deficit.     Motor: No weakness.     Coordination: Coordination normal.     Gait: Gait normal.  Psychiatric:        Mood  and Affect: Mood normal.        Thought Content: Thought content normal.       No results found for any visits on 01/03/20.  Assessment & Plan     1. Vertigo Possibly related with higher BP, but want to evaluate other peripheral vs central sources. Does not sound like BPPV as symptoms last for hours. Meniere's low suspicion due to no associated hearing changes during vertigo attacks. Orthostatics were negative. Advised to try Bonine OTC. Push fluids. Referral to ENT for further evaluation placed. Consult appreciated.  - Ambulatory referral to ENT  2. Gastroesophageal reflux disease without esophagitis Worsening, suspected due to NSAID use. Advised to stop NSAIDs and use tylenol 500mg  2 tabs TID prn. Will give protonix as below for 3 months. Call if worsening or not improving.  - pantoprazole (PROTONIX) 40 MG tablet; Take 1 tablet (40 mg total) by mouth 2 (two) times daily before a meal.  Dispense: 180 tablet; Refill: 0  3. Essential hypertension Has been elevated recently. Will start Maxzide as below for HTN. Advised to check BP in 2-3 weeks and send results via mychart. Will adjust as needed.   - triamterene-hydrochlorothiazide (MAXZIDE-25) 37.5-25 MG tablet; Take 1 tablet by mouth daily.  Dispense: 90 tablet; Refill: 3   No follow-ups on file.      01-23-1992, PA-C, have reviewed all documentation for this visit. The documentation on 01/03/20 for the exam, diagnosis, procedures, and orders are all accurate and complete.   01/05/20  [facility] [phone] (phone) [phone] (fax)  [facility]

## 2020-01-03 NOTE — Telephone Encounter (Signed)
Patient seen in office today. 

## 2020-01-16 ENCOUNTER — Other Ambulatory Visit: Payer: Self-pay | Admitting: Otolaryngology

## 2020-01-16 DIAGNOSIS — R42 Dizziness and giddiness: Secondary | ICD-10-CM

## 2020-01-16 DIAGNOSIS — H9311 Tinnitus, right ear: Secondary | ICD-10-CM

## 2020-02-01 ENCOUNTER — Ambulatory Visit: Admission: RE | Admit: 2020-02-01 | Payer: PRIVATE HEALTH INSURANCE | Source: Ambulatory Visit

## 2020-03-26 ENCOUNTER — Other Ambulatory Visit: Payer: Self-pay | Admitting: Podiatry

## 2020-04-14 NOTE — Progress Notes (Signed)
Established patient visit   Patient: Benjamin Ritter   DOB: April 16, 1981   39 y.o. Male  MRN: [id number] Visit Date: 04/15/2020  Today's healthcare provider: [name], PA-C   Chief Complaint  Patient presents with  . Anxiety  . Obesity  . Gastroesophageal Reflux  . Hypertension   Subjective    HPI  Follow up for GERD  The patient was last seen for this 3 months ago. Changes made at last visit include Advised to stop NSAIDs and use tylenol 500mg  2 tabs TID prn. Will give protonix .  He reports excellent compliance with treatment. He feels that condition is Improved. He is not having side effects.  Reports that when he gets anxious he feels symptoms like someone is squeezing his chest. -----------------------------------------------------------------------------------------   Follow up for Essential Hypertension  The patient was last seen for this 3 months ago. Changes made at last visit include Will start Maxzide   He reports excellent compliance with treatment. He feels that condition is Improved. He is not having side effects. Reports that when he has checked it has been pretty good numbers. BP Readings from Last 3 Encounters:  04/15/20 131/83  01/03/20 (!) 141/88  10/22/19 116/80   ----------------------------------------------------------------------------------------- Anxiety: Patient complains of anxiety disorder and panic attacks.  He has the following symptoms: chest tightness Reports that this happens when he gets push to get certain work done.He denies current suicidal and homicidal ideation. Family history significant for anxiety.Possible organic causes contributing are: none. Risk factors: positive family history in  brother(s) and sister(s) Previous treatment includes none and none.   GAD 7 : Generalized Anxiety Score 04/15/2020  Nervous, Anxious, on Edge 2  Control/stop worrying 1  Worry too much - different things 1  Trouble relaxing 0   Restless 0  Easily annoyed or irritable 2  Afraid - awful might happen 0  Total GAD 7 Score 6  Anxiety Difficulty Somewhat difficult    Depression screen PHQ 2/9 04/15/2020  Decreased Interest 0  Down, Depressed, Hopeless 0  PHQ - 2 Score 0  Altered sleeping -  Tired, decreased energy -  Change in appetite -  Feeling bad or failure about yourself  -  Trouble concentrating -  Moving slowly or fidgety/restless -  Suicidal thoughts -  PHQ-9 Score -  Difficult doing work/chores -   Obesity: Patient complains of obesity. Patient cites health, increased physical ability, self-image as reasons for wanting to lose weight.  Obesity History Weight in late teens: 145 lb. Period of greatest weight gain: 100 lb during early adult years Lowest adult weight: 200 lbs Highest adult weight: 294 lbs   History of Weight Loss Efforts Greatest amount of weight lost: 12 lb over 4 weeks ago Amount of time that loss was maintained: 4 weeks Circumstances associated with regain of weight: being at home, bored Successful weight loss techniques attempted: exercise Unsuccessful weight loss techniques attempted: none  Current Exercise Habits none   Current Eating Habits Number of regular meals per day: 3 Number of snacking episodes per day: 3 Who shops for food? patient and wife Who prepares food? patient and wife Who eats with patient? patient and family Binge behavior?: no Purge behavior? no Anorexic behavior? no Eating precipitated by stress? no Guilt feelings associated with eating? yes - sometimes  Other Potential Contributing Factors Use of alcohol: average 1 drinks/month Use of medications that may cause weight gain none History of past abuse? none Psych History: none Comorbidities:  dyslipidemias, GERD and hypertension   Patient Active Problem List   Diagnosis Date Noted  . Obese 10/22/2019  . Annual physical exam 10/22/2019   Past Medical History:  Diagnosis Date  . H/O  heartburn   . Transient elevated blood pressure        Medications: Outpatient Medications Prior to Visit  Medication Sig  . [DISCONTINUED] pantoprazole (PROTONIX) 40 MG tablet Take 1 tablet (40 mg total) by mouth 2 (two) times daily before a meal.  . [DISCONTINUED] triamterene-hydrochlorothiazide (DYAZIDE) 37.5-25 MG capsule Take 1 each (1 capsule total) by mouth daily.  . [DISCONTINUED] meloxicam (MOBIC) 15 MG tablet TAKE 1 TABLET BY MOUTH EVERY DAY   No facility-administered medications prior to visit.    Review of Systems  Constitutional: Positive for activity change and appetite change.  Respiratory: Negative.   Cardiovascular: Negative.   Neurological: Negative.   Psychiatric/Behavioral: Negative.     Last CBC Lab Results  Component Value Date   WBC 6.5 10/25/2019   HGB 14.5 10/25/2019   HCT 44.4 10/25/2019   MCV 85 10/25/2019   MCH 27.8 10/25/2019   RDW 13.1 10/25/2019   PLT 221 10/25/2019   Last metabolic panel Lab Results  Component Value Date   GLUCOSE 92 10/25/2019   NA 136 10/25/2019   K 4.2 10/25/2019   CL 98 10/25/2019   CO2 25 10/25/2019   BUN 12 10/25/2019   CREATININE 0.75 (L) 10/25/2019   GFRNONAA 116 10/25/2019   GFRAA 134 10/25/2019   CALCIUM 9.1 10/25/2019   PROT 7.1 10/25/2019   ALBUMIN 4.5 10/25/2019   LABGLOB 2.6 10/25/2019   AGRATIO 1.7 10/25/2019   BILITOT 0.5 10/25/2019   ALKPHOS 61 10/25/2019   AST 16 10/25/2019   ALT 16 10/25/2019      Objective    BP 131/83 (BP Location: Left Arm, Patient Position: Sitting, Cuff Size: Large)   Pulse 86   Temp 98.4 F (36.9 C) (Oral)   Resp 16   Ht 6\' 2"  (1.88 m)   Wt 294 lb 11.2 oz (133.7 kg)   BMI 37.84 kg/m  BP Readings from Last 3 Encounters:  04/15/20 131/83  01/03/20 (!) 141/88  10/22/19 116/80   Wt Readings from Last 3 Encounters:  04/15/20 294 lb 11.2 oz (133.7 kg)  01/03/20 281 lb (127.5 kg)  10/22/19 289 lb (131.1 kg)      Physical Exam Vitals reviewed.   Constitutional:      General: He is not in acute distress.    Appearance: Normal appearance. He is well-developed and well-nourished. He is obese. He is not ill-appearing or diaphoretic.  HENT:     Head: Normocephalic and atraumatic.  Cardiovascular:     Rate and Rhythm: Normal rate and regular rhythm.     Pulses: Normal pulses.     Heart sounds: Normal heart sounds. No murmur heard. No friction rub. No gallop.   Pulmonary:     Effort: Pulmonary effort is normal. No respiratory distress.     Breath sounds: Normal breath sounds. No wheezing or rales.  Musculoskeletal:     Cervical back: Normal range of motion and neck supple.  Neurological:     Mental Status: He is alert.  Psychiatric:        Mood and Affect: Mood normal.        Thought Content: Thought content normal.      No results found for any visits on 04/15/20.  Assessment & Plan     1.  Gastroesophageal reflux disease without esophagitis Worsening. Increase protonix to 40mg  BID. F/U in 4-6 weeks.  - pantoprazole (PROTONIX) 40 MG tablet; Take 1 tablet (40 mg total) by mouth 2 (two) times daily before a meal.  Dispense: 180 tablet; Refill: 3  2. Essential hypertension BP improved with triamterene-HCTZ. Continue current dose.  - triamterene-hydrochlorothiazide (DYAZIDE) 37.5-25 MG capsule; Take 1 each (1 capsule total) by mouth daily.  Dispense: 90 capsule; Refill: 3  3. GAD (generalized anxiety disorder) Worsening recently. Will start Celexa as below. F/U in 4-6 weeks.  - citalopram (CELEXA) 20 MG tablet; Take 1 tablet (20 mg total) by mouth daily.  Dispense: 30 tablet; Refill: 3  4. Class 2 obesity due to excess calories without serious comorbidity with body mass index (BMI) of 37.0 to 37.9 in adult Counseled patient on healthy lifestyle modifications including dieting and exercise. Also wanting to start phentermine but with anxiety we will hold off until better controlled. He is in agreement. F/U in 4-6 weeks. May  start phentermine then if improved.   Return in about 4 weeks (around 05/13/2020) for anxiety and weight.      05/15/2020, PA-C, have reviewed all documentation for this visit. The documentation on 04/20/20 for the exam, diagnosis, procedures, and orders are all accurate and complete.   06/18/20  [facility] [phone] (phone) [phone] (fax)  [facility]

## 2020-04-15 ENCOUNTER — Encounter: Payer: Self-pay | Admitting: Physician Assistant

## 2020-04-15 ENCOUNTER — Other Ambulatory Visit: Payer: Self-pay

## 2020-04-15 ENCOUNTER — Ambulatory Visit: Payer: PRIVATE HEALTH INSURANCE | Admitting: Physician Assistant

## 2020-04-15 VITALS — BP 131/83 | HR 86 | Temp 98.4°F | Resp 16 | Ht 74.0 in | Wt 294.7 lb

## 2020-04-15 DIAGNOSIS — I10 Essential (primary) hypertension: Secondary | ICD-10-CM

## 2020-04-15 DIAGNOSIS — E6609 Other obesity due to excess calories: Secondary | ICD-10-CM | POA: Diagnosis not present

## 2020-04-15 DIAGNOSIS — K219 Gastro-esophageal reflux disease without esophagitis: Secondary | ICD-10-CM

## 2020-04-15 DIAGNOSIS — F411 Generalized anxiety disorder: Secondary | ICD-10-CM

## 2020-04-15 DIAGNOSIS — Z6837 Body mass index (BMI) 37.0-37.9, adult: Secondary | ICD-10-CM

## 2020-04-15 MED ORDER — TRIAMTERENE-HCTZ 37.5-25 MG PO CAPS: 1.0000 | ORAL_CAPSULE | Freq: Every day | ORAL | 3 refills | Status: DC

## 2020-04-15 MED ORDER — PANTOPRAZOLE SODIUM 40 MG PO TBEC: 40.0000 mg | DELAYED_RELEASE_TABLET | Freq: Two times a day (BID) | ORAL | 3 refills | Status: DC

## 2020-04-15 MED ORDER — CITALOPRAM HYDROBROMIDE 20 MG PO TABS: 20.0000 mg | ORAL_TABLET | Freq: Every day | ORAL | 3 refills | Status: DC

## 2020-04-15 NOTE — Patient Instructions (Signed)
10 Relaxation Techniques That Zap Stress Fast By Jeannette Moninger   Listen  Relax. You deserve it, it's good for you, and it takes less time than you think. You don't need a spa weekend or a retreat. Each of these stress-relieving tips can get you from OMG to om in less than 15 minutes. 1. Meditate  A few minutes of practice per day can help ease anxiety. "Research suggests that daily meditation may alter the brain's neural pathways, making you more resilient to stress," says psychologist Robbie Maller Hartman, PhD, a [place] health and wellness coach. It's simple. Sit up straight with both feet on the floor. Close your eyes. Focus your attention on reciting -- out loud or silently -- a positive mantra such as "I feel at peace" or "I love myself." Place one hand on your belly to sync the mantra with your breaths. Let any distracting thoughts float by like clouds. 2. Breathe Deeply  Take a 5-minute break and focus on your breathing. Sit up straight, eyes closed, with a hand on your belly. Slowly inhale through your nose, feeling the breath start in your abdomen and work its way to the top of your head. Reverse the process as you exhale through your mouth.  "Deep breathing counters the effects of stress by slowing the heart rate and lowering blood pressure," psychologist Judith Tutin, PhD, says. She's a certified life coach in [place], [place] 3. Be Present  Slow down.  "Take 5 minutes and focus on only one behavior with awareness," Tutin says. Notice how the air feels on your face when you're walking and how your feet feel hitting the ground. Enjoy the texture and taste of each bite of food. When you spend time in the moment and focus on your senses, you should feel less tense. 4. Reach Out  Your social network is one of your best tools for handling stress. Talk to others -- preferably face to face, or at least on the phone. Share what's going on. You can get a fresh perspective while keeping your  connection strong. 5. Tune In to Your Body  Mentally scan your body to get a sense of how stress affects it each day. Lie on your back, or sit with your feet on the floor. Start at your toes and work your way up to your scalp, noticing how your body feels.  "Simply be aware of places you feel tight or loose without trying to change anything," Tutin says. For 1 to 2 minutes, imagine each deep breath flowing to that body part. Repeat this process as you move your focus up your body, paying close attention to sensations you feel in each body part. 6. Decompress  Place a warm heat wrap around your neck and shoulders for 10 minutes. Close your eyes and relax your face, neck, upper chest, and back muscles. Remove the wrap, and use a tennis ball or foam roller to massage away tension.  "Place the ball between your back and the wall. Lean into the ball, and hold gentle pressure for up to 15 seconds. Then move the ball to another spot, and apply pressure," says Cathy Benninger, a nurse practitioner and assistant professor at [facility] in [place]. 7. Laugh Out Loud  A good belly laugh doesn't just lighten the load mentally. It lowers cortisol, your body's stress hormone, and boosts brain chemicals called endorphins, which help your mood. Lighten up by tuning in to your favorite sitcom or video, reading   the comics, or chatting with someone who makes you smile. 8. Crank Up the Tunes  Research shows that listening to soothing music can lower blood pressure, heart rate, and anxiety. "Create a playlist of songs or nature sounds (the ocean, a bubbling brook, birds chirping), and allow your mind to focus on the different melodies, instruments, or singers in the piece," Benninger says. You also can blow off steam by rocking out to more upbeat tunes -- or singing at the top of your lungs! 9. Get Moving  You don't have to run in order to get a runner's high. All forms of exercise,  including yoga and walking, can ease depression and anxiety by helping the brain release feel-good chemicals and by giving your body a chance to practice dealing with stress. You can go for a quick walk around the block, take the stairs up and down a few flights, or do some stretching exercises like head rolls and shoulder shrugs. 10. Be Grateful  Keep a gratitude journal or several (one by your bed, one in your purse, and one at work) to help you remember all the things that are good in your life.  "Being grateful for your blessings cancels out negative thoughts and worries," says Joni Emmerling, a wellness coach in [place], [place].  Use these journals to savor good experiences like a child's smile, a sunshine-filled day, and good health. Don't forget to celebrate accomplishments like mastering a new task at work or a new hobby. When you start feeling stressed, spend a few minutes looking through your notes to remind yourself what really matters.  Citalopram tablets What is this medicine? CITALOPRAM (sye TAL oh pram) is a medicine for depression. This medicine may be used for other purposes; ask your health care provider or pharmacist if you have questions. COMMON BRAND NAME(S): Celexa What should I tell my health care provider before I take this medicine? They need to know if you have any of these conditions:  bleeding disorders  bipolar disorder or a family history of bipolar disorder  glaucoma  heart disease  history of irregular heartbeat  kidney disease  liver disease  low levels of magnesium or potassium in the blood  receiving electroconvulsive therapy  seizures  suicidal thoughts, plans, or attempt; a previous suicide attempt by you or a family member  take medicines that treat or prevent blood clots  thyroid disease  an unusual or allergic reaction to citalopram, escitalopram, other medicines, foods, dyes, or preservatives  pregnant or trying to become  pregnant  breast-feeding How should I use this medicine? Take this medicine by mouth with a glass of water. Follow the directions on the prescription label. You can take it with or without food. Take your medicine at regular intervals. Do not take your medicine more often than directed. Do not stop taking this medicine suddenly except upon the advice of your doctor. Stopping this medicine too quickly may cause serious side effects or your condition may worsen. A special MedGuide will be given to you by the pharmacist with each prescription and refill. Be sure to read this information carefully each time. Talk to your pediatrician regarding the use of this medicine in children. Special care may be needed. Patients over 1 years old may have a stronger reaction and need a smaller dose. Overdosage: If you think you have taken too much of this medicine contact a poison control center or emergency room at once. NOTE: This medicine is only for you. Do not share  this medicine with others. What if I miss a dose? If you miss a dose, take it as soon as you can. If it is almost time for your next dose, take only that dose. Do not take double or extra doses. What may interact with this medicine? Do not take this medicine with any of the following medications:  certain medicines for fungal infections like fluconazole, itraconazole, ketoconazole, posaconazole, voriconazole  cisapride  dronedarone  escitalopram  linezolid  MAOIs like Carbex, Eldepryl, Marplan, Nardil, and Parnate  methylene blue (injected into a vein)  pimozide  thioridazine This medicine may also interact with the following medications:  alcohol  amphetamines  aspirin and aspirin-like medicines  carbamazepine  certain medicines for depression, anxiety, or psychotic disturbances  certain medicines for infections like chloroquine, clarithromycin, erythromycin, furazolidone, isoniazid, pentamidine  certain medicines for  migraine headaches like almotriptan, eletriptan, frovatriptan, naratriptan, rizatriptan, sumatriptan, zolmitriptan  certain medicines for sleep  certain medicines that treat or prevent blood clots like dalteparin, enoxaparin, warfarin  cimetidine  diuretics  dofetilide  fentanyl  lithium  methadone  metoprolol  NSAIDs, medicines for pain and inflammation, like ibuprofen or naproxen  omeprazole  other medicines that prolong the QT interval (cause an abnormal heart rhythm)  procarbazine  rasagiline  supplements like St. John's wort, kava kava, valerian  tramadol  tryptophan  ziprasidone This list may not describe all possible interactions. Give your health care provider a list of all the medicines, herbs, non-prescription drugs, or dietary supplements you use. Also tell them if you smoke, drink alcohol, or use illegal drugs. Some items may interact with your medicine. What should I watch for while using this medicine? Tell your doctor if your symptoms do not get better or if they get worse. Visit your doctor or health care professional for regular checks on your progress. Because it may take several weeks to see the full effects of this medicine, it is important to continue your treatment as prescribed by your doctor. Patients and their families should watch out for new or worsening thoughts of suicide or depression. Also watch out for sudden changes in feelings such as feeling anxious, agitated, panicky, irritable, hostile, aggressive, impulsive, severely restless, overly excited and hyperactive, or not being able to sleep. If this happens, especially at the beginning of treatment or after a change in dose, call your health care professional. Bonita Quin may get drowsy or dizzy. Do not drive, use machinery, or do anything that needs mental alertness until you know how this medicine affects you. Do not stand or sit up quickly, especially if you are an older patient. This reduces the  risk of dizzy or fainting spells. Alcohol may interfere with the effect of this medicine. Avoid alcoholic drinks. Your mouth may get dry. Chewing sugarless gum or sucking hard candy, and drinking plenty of water will help. Contact your doctor if the problem does not go away or is severe. What side effects may I notice from receiving this medicine? Side effects that you should report to your doctor or health care professional as soon as possible:  allergic reactions like skin rash, itching or hives, swelling of the face, lips, or tongue  anxious  black, tarry stools  breathing problems  changes in vision  chest pain  confusion  elevated mood, decreased need for sleep, racing thoughts, impulsive behavior  eye pain  fast, irregular heartbeat  feeling faint or lightheaded, falls  feeling agitated, angry, or irritable  hallucination, loss of contact with reality  loss of balance or coordination  loss of memory  painful or prolonged erections  restlessness, pacing, inability to keep still  seizures  stiff muscles  suicidal thoughts or other mood changes  trouble sleeping  unusual bleeding or bruising  unusually weak or tired  vomiting Side effects that usually do not require medical attention (report to your doctor or health care professional if they continue or are bothersome):  change in appetite or weight  change in sex drive or performance  dizziness  headache  increased sweating  indigestion, nausea  tremors This list may not describe all possible side effects. Call your doctor for medical advice about side effects. You may report side effects to FDA at 1-800-FDA-1088. Where should I keep my medicine? Keep out of reach of children. Store at room temperature between 15 and 30 degrees C (59 and 86 degrees F). Throw away any unused medicine after the expiration date. NOTE: This sheet is a summary. It may not cover all possible information. If you  have questions about this medicine, talk to your doctor, pharmacist, or health care provider.  2020 Elsevier/Gold Standard (2018-03-26 09:05:36)  Phentermine tablets or capsules What is this medicine? PHENTERMINE (FEN ter meen) decreases your appetite. It is used with a reduced calorie diet and exercise to help you lose weight. This medicine may be used for other purposes; ask your health care provider or pharmacist if you have questions. COMMON BRAND NAME(S): Adipex-P, Atti-Plex P, Atti-Plex P Spansule, Fastin, Lomaira, Pro-Fast, Tara-8 What should I tell my health care provider before I take this medicine? They need to know if you have any of these conditions:  agitation or nervousness  diabetes  glaucoma  heart disease  high blood pressure  history of drug abuse or addiction  history of stroke  kidney disease  lung disease called Primary Pulmonary Hypertension (PPH)  taken an MAOI like Carbex, Eldepryl, Marplan, Nardil, or Parnate in last 14 days  taking stimulant medicines for attention disorders, weight loss, or to stay awake  thyroid disease  an unusual or allergic reaction to phentermine, other medicines, foods, dyes, or preservatives  pregnant or trying to get pregnant  breast-feeding How should I use this medicine? Take this medicine by mouth with a glass of water. Follow the directions on the prescription label. Take your medicine at regular intervals. Do not take it more often than directed. Do not stop taking except on your doctor's advice. Talk to your pediatrician regarding the use of this medicine in children. While this drug may be prescribed for children 17 years or older for selected conditions, precautions do apply. Overdosage: If you think you have taken too much of this medicine contact a poison control center or emergency room at once. NOTE: This medicine is only for you. Do not share this medicine with others. What if I miss a dose? If you miss a  dose, take it as soon as you can. If it is almost time for your next dose, take only that dose. Do not take double or extra doses. What may interact with this medicine? Do not take this medicine with any of the following medications:  MAOIs like Carbex, Eldepryl, Marplan, Nardil, and Parnate This medicine may also interact with the following medications:  alcohol  certain medicines for depression, anxiety, or psychotic disorders  certain medicines for high blood pressure  linezolid  medicines for colds or breathing difficulties like pseudoephedrine or phenylephrine  medicines for diabetes  sibutramine  stimulant medicines for  attention disorders, weight loss, or to stay awake This list may not describe all possible interactions. Give your health care provider a list of all the medicines, herbs, non-prescription drugs, or dietary supplements you use. Also tell them if you smoke, drink alcohol, or use illegal drugs. Some items may interact with your medicine. What should I watch for while using this medicine? Visit your doctor or health care provider for regular checks on your progress. Do not stop taking except on your health care provider's advice. You may develop a severe reaction. Your health care provider will tell you how much medicine to take. Do not take this medicine close to bedtime. It may prevent you from sleeping. You may get drowsy or dizzy. Do not drive, use machinery, or do anything that needs mental alertness until you know how this medicine affects you. Do not stand or sit up quickly, especially if you are an older patient. This reduces the risk of dizzy or fainting spells. Alcohol may increase dizziness and drowsiness. Avoid alcoholic drinks. This medicine may affect blood sugar levels. Ask your healthcare provider if changes in diet or medicines are needed if you have diabetes. Women should inform their health care provider if they wish to become pregnant or think they  might be pregnant. Losing weight while pregnant is not advised and may cause harm to the unborn child. Talk to your health care provider for more information. What side effects may I notice from receiving this medicine? Side effects that you should report to your doctor or health care professional as soon as possible:  allergic reactions like skin rash, itching or hives, swelling of the face, lips, or tongue  breathing problems  changes in emotions or moods  changes in vision  chest pain or chest tightness  fast, irregular heartbeat  feeling faint or lightheaded  increased blood pressure  irritable  restlessness  tremors  seizures  signs and symptoms of a stroke like changes in vision; confusion; trouble speaking or understanding; severe headaches; sudden numbness or weakness of the face, arm or leg; trouble walking; dizziness; loss of balance or coordination  unusually weak or tired Side effects that usually do not require medical attention (report to your doctor or health care professional if they continue or are bothersome):  changes in taste  constipation or diarrhea  dizziness  dry mouth  headache  trouble sleeping  upset stomach This list may not describe all possible side effects. Call your doctor for medical advice about side effects. You may report side effects to FDA at 1-800-FDA-1088. Where should I keep my medicine? Keep out of the reach of children. This medicine can be abused. Keep your medicine in a safe place to protect it from theft. Do not share this medicine with anyone. Selling or giving away this medicine is dangerous and against the law. This medicine may cause harm and death if it is taken by other adults, children, or pets. Return medicine that has not been used to an official disposal site. Contact the DEA at 951-882-5461 or your city/county government to find a site. If you cannot return the medicine, mix any unused medicine with a  substance like cat litter or coffee grounds. Then throw the medicine away in a sealed container like a sealed bag or coffee can with a lid. Do not use the medicine after the expiration date. Store at room temperature between 20 and 25 degrees C (68 and 77 degrees F). Keep container tightly closed. NOTE: This sheet  is a summary. It may not cover all possible information. If you have questions about this medicine, talk to your doctor, pharmacist, or health care provider.  2020 Elsevier/Gold Standard (2019-02-08 12:54:20)

## 2020-04-20 ENCOUNTER — Encounter: Payer: Self-pay | Admitting: Physician Assistant

## 2020-05-07 ENCOUNTER — Other Ambulatory Visit: Payer: Self-pay | Admitting: Physician Assistant

## 2020-05-07 DIAGNOSIS — F411 Generalized anxiety disorder: Secondary | ICD-10-CM

## 2020-05-07 NOTE — Telephone Encounter (Signed)
Requested medication (s) are due for refill today: no  Requested medication (s) are on the active medication list: yes  Last refill:  04/14/20  #30  3 refills  Future visit scheduled: yes  Notes to clinic:  pharmacy requesting Dx code and 90 day RX. New medication Has follow up scheduled 1 week    Requested Prescriptions  Pending Prescriptions Disp Refills   citalopram (CELEXA) 20 MG tablet [Pharmacy Med Name: CITALOPRAM HBR 20 MG TABLET] 90 tablet 2    Sig: TAKE 1 TABLET BY MOUTH EVERY DAY      Psychiatry:  Antidepressants - SSRI Passed - 05/07/2020  1:33 PM      Passed - Valid encounter within last 6 months    Recent Outpatient Visits           3 weeks ago GAD (generalized anxiety disorder)   [facility] [place], Alessandra Bevels, [place]   4 months ago Vertigo   [facility] [place], [place]andra Bevel[place], [place]   6 months ago Annual physical exam   [facility] [place], [place]andra Be[place], [place]   1 year ago Administrative encounter   [facility] [place], [place], [place]   2 years ago Acute viral syndrome   [facility] [place], [place], [place]       Future Appointments             In 1 week Rosezetta Schlatter, Alessandra Bevels, PA-C [employer], [facility]

## 2020-05-15 ENCOUNTER — Other Ambulatory Visit: Payer: Self-pay

## 2020-05-15 ENCOUNTER — Ambulatory Visit: Payer: PRIVATE HEALTH INSURANCE | Admitting: Physician Assistant

## 2020-05-15 ENCOUNTER — Encounter: Payer: Self-pay | Admitting: Physician Assistant

## 2020-05-15 VITALS — BP 127/84 | HR 75 | Temp 98.6°F | Ht 74.0 in | Wt 295.0 lb

## 2020-05-15 DIAGNOSIS — K219 Gastro-esophageal reflux disease without esophagitis: Secondary | ICD-10-CM | POA: Diagnosis not present

## 2020-05-15 DIAGNOSIS — Z6837 Body mass index (BMI) 37.0-37.9, adult: Secondary | ICD-10-CM

## 2020-05-15 DIAGNOSIS — I10 Essential (primary) hypertension: Secondary | ICD-10-CM

## 2020-05-15 DIAGNOSIS — F411 Generalized anxiety disorder: Secondary | ICD-10-CM | POA: Diagnosis not present

## 2020-05-15 MED ORDER — PHENTERMINE HCL 37.5 MG PO CAPS: 37.5000 mg | ORAL_CAPSULE | ORAL | 2 refills | Status: DC

## 2020-05-15 NOTE — Progress Notes (Signed)
Established patient visit   Patient: Benjamin Ritter   DOB: Jan 20, 1981   40 y.o. Male  MRN: [id number] Visit Date: 05/15/2020  Today's healthcare provider: [name], PA-C   Chief Complaint  Patient presents with  . Gastroesophageal Reflux  . Anxiety   Subjective    HPI  Anxiety, Follow-up  He was last seen for anxiety 4 weeks ago. Changes made at last visit include starting citalopram 20mg  daily.   He reports excellent compliance with treatment. He reports excellent tolerance of treatment. He is not having side effects.   He feels his anxiety is mild and Improved since last visit.  Symptoms: No chest pain No difficulty concentrating  No dizziness No fatigue  No feelings of losing control No insomnia  Yes irritable No palpitations  No panic attacks No racing thoughts  No shortness of breath No sweating  No tremors/shakes    GAD-7 Results GAD-7 Generalized Anxiety Disorder Screening Tool 05/15/2020 04/15/2020  1. Feeling Nervous, Anxious, or on Edge 0 2  2. Not Being Able to Stop or Control Worrying 0 1  3. Worrying Too Much About Different Things 0 1  4. Trouble Relaxing 0 0  5. Being So Restless it's Hard To Sit Still 0 0  6. Becoming Easily Annoyed or Irritable 0 2  7. Feeling Afraid As If Something Awful Might Happen 0 0  Total GAD-7 Score 0 6  Difficulty At Work, Home, or Getting  Along With Others? Not difficult at all Somewhat difficult    PHQ-9 Scores PHQ9 SCORE ONLY 05/15/2020 04/15/2020 10/22/2019  PHQ-9 Total Score 2 0 0    --------------------------------------------------------------------------------------------------- Follow up for Reflux  The patient was last seen for this 4 weeks ago. Changes made at last visit include increasing Protonix to 40mg  twice a day.  He reports excellent compliance with treatment.  Pt states he is only needs to take it once a day right now.  He feels that condition is Improved.   He is not having  side effects.   -----------------------------------------------------------------------------------------  Follow up for Obesity  The patient was last seen for this 4 months ago. Changes made at last visit include working on healthy lifestyle modifications including diet and exercise.  Will consider phentermine when anxiety is under good control. 12/23/2019  He reports excellent compliance with treatment.  -----------------------------------------------------------------------------------------     Medications: Outpatient Medications Prior to Visit  Medication Sig  . citalopram (CELEXA) 20 MG tablet TAKE 1 TABLET BY MOUTH EVERY DAY  . pantoprazole (PROTONIX) 40 MG tablet Take 1 tablet (40 mg total) by mouth 2 (two) times daily before a meal.  . triamterene-hydrochlorothiazide (DYAZIDE) 37.5-25 MG capsule Take 1 each (1 capsule total) by mouth daily.   No facility-administered medications prior to visit.    Review of Systems  Constitutional: Negative.   Respiratory: Negative.   Cardiovascular: Negative.   Gastrointestinal: Negative.   Neurological: Negative for dizziness, light-headedness and headaches.  Psychiatric/Behavioral: Negative for decreased concentration, dysphoric mood, self-injury, sleep disturbance and suicidal ideas. The patient is not nervous/anxious.        Objective    BP 127/84 (BP Location: Right Arm, Patient Position: Sitting, Cuff Size: Large)   Pulse 75   Temp 98.6 F (37 C) (Oral)   Ht 6\' 2"  (1.88 m)   Wt 295 lb (133.8 kg)   BMI 37.88 kg/m     Physical Exam Vitals reviewed.  Constitutional:      General: He is  not in acute distress.    Appearance: Normal appearance. He is well-developed and well-nourished. He is not diaphoretic.  HENT:     Head: Normocephalic and atraumatic.  Cardiovascular:     Rate and Rhythm: Normal rate and regular rhythm.     Heart sounds: Normal heart sounds. No murmur heard. No friction rub. No gallop.   Pulmonary:      Effort: Pulmonary effort is normal. No respiratory distress.     Breath sounds: Normal breath sounds. No wheezing or rales.  Musculoskeletal:     Cervical back: Normal range of motion and neck supple.  Neurological:     Mental Status: He is alert.       No results found for any visits on 05/15/20.  Assessment & Plan     1. Class 2 severe obesity due to excess calories with serious comorbidity and body mass index (BMI) of 37.0 to 37.9 in adult [facility]) Will start phentermine as below for weight loss. Has been working on healthy dieting, limiting portion sizes, increasing water intake and exercise. F/U in 3 months for a weight recheck.  - phentermine 37.5 MG capsule; Take 1 capsule (37.5 mg total) by mouth every morning.  Dispense: 30 capsule; Refill: 2  2. GAD (generalized anxiety disorder) Stable. Improved with Citalopram 20mg .   3. Primary hypertension Stable. Improved with Triamterene-HCTZ 37.5-25mg .   4. Gastroesophageal reflux disease without esophagitis Stable on Pantoprazole 40mg .    No follow-ups on file.      , PA-C, have reviewed all documentation for this visit. The documentation on 06/05/20 for the exam, diagnosis, procedures, and orders are all accurate and complete.   Delmer Islam  [facility] [phone] (phone) [phone] (fax)  [facility]

## 2020-06-05 ENCOUNTER — Encounter: Payer: Self-pay | Admitting: Physician Assistant

## 2020-06-09 ENCOUNTER — Other Ambulatory Visit: Payer: Self-pay

## 2020-06-09 ENCOUNTER — Encounter: Payer: Self-pay | Admitting: Physician Assistant

## 2020-06-09 ENCOUNTER — Encounter: Payer: Self-pay | Admitting: Adult Health

## 2020-06-09 ENCOUNTER — Ambulatory Visit: Payer: PRIVATE HEALTH INSURANCE | Admitting: Adult Health

## 2020-06-09 VITALS — BP 129/88 | HR 76 | Temp 98.1°F | Resp 16

## 2020-06-09 DIAGNOSIS — R55 Syncope and collapse: Secondary | ICD-10-CM

## 2020-06-09 DIAGNOSIS — R638 Other symptoms and signs concerning food and fluid intake: Secondary | ICD-10-CM

## 2020-06-09 DIAGNOSIS — R42 Dizziness and giddiness: Secondary | ICD-10-CM | POA: Diagnosis not present

## 2020-06-09 NOTE — Progress Notes (Signed)
Established patient visit   Patient: Benjamin Ritter   DOB: 1981-02-08   40 y.o. Male  MRN: [id number] Visit Date: 06/09/2020  Today's healthcare provider: Jairo Ben, FNP   Chief Complaint  Patient presents with  . Loss of Consciousness   Subjective    Loss of Consciousness This is a new problem. The current episode started today (4am this morning). He lost consciousness for a period of less than 1 minute. Exacerbated by: patient reports that he had voided and when he stood up to exit bathroom wife states that patient appeared to fell in between comode. Associated symptoms include confusion and light-headedness. Pertinent negatives include no abdominal pain, auditory change, aura, back pain, bladder incontinence, bowel incontinence, chest pain, clumsiness, diaphoresis, dizziness, fever, focal sensory loss, focal weakness, headaches, malaise/fatigue, nausea, palpitations, slurred speech, vertigo, visual change, vomiting or weakness. He has tried nothing for the symptoms.    He had eaten later at 745 pm and he had small chicken , mashed potatoes, he has cut out all sodas had water with dinner. He has cut portions back substantially since starting Phentermine 2 weeks ago.  He had been  lightheaded all of the day yesterday and got up at 4 am and he passed out when he turned around turned the  water on to wash his hands after urinating.  He was feeling even more lightheaded at that time.  Denies any prior history of syncope. Wife reports he was passed out and eyes were glazed over and opened. Denies any tongue biting, eye rolling or any jerking movements.   He busted the back of the toilet with his upper body. Denies any injury or head trauma.  Denies any other syncopal or presyncopal episodes.   He denies any recent illness. He has recently started on Phentermine, and he has been eating smaller meals. He has skipped breakfast here and there.   He did get his Covid Booster  at [facility] Friday at 4pm and no side effects known.   Patient  denies any fever, body aches,chills, rash, chest pain, shortness of breath, nausea, vomiting, or diarrhea.  Denies dizziness, lightheadedness, pre syncopal or syncopal episodes since the above mentioned episode. Marland Kitchen    He canceled his already scheduled MRI.   Past Medical History:  Diagnosis Date  . H/O heartburn   . Transient elevated blood pressure    Past Surgical History:  Procedure Laterality Date  . NO PAST SURGERIES     Social History   Tobacco Use  . Smoking status: Never Smoker  . Smokeless tobacco: Never Used  Substance Use Topics  . Alcohol use: Yes  . Drug use: No   Social History   Socioeconomic History  . Marital status: Married    Spouse name: Not on file  . Number of children: Not on file  . Years of education: Not on file  . Highest education level: Not on file  Occupational History  . Not on file  Tobacco Use  . Smoking status: Never Smoker  . Smokeless tobacco: Never Used  Substance and Sexual Activity  . Alcohol use: Yes  . Drug use: No  . Sexual activity: Yes  Other Topics Concern  . Not on file  Social History Narrative  . Not on file   Social Determinants of Health   Financial Resource Strain: Not on file  Food Insecurity: Not on file  Transportation Needs: Not on file  Physical Activity: Not on file  Stress: Not on file  Social Connections: Not on file  Intimate Partner Violence: Not on file   Family Status  Relation Name Status  . Father  Alive  . Sister Everlean Patterson  . Brother  Alive  . Daughter  Alive  . MGM  Deceased  . PGM  Deceased  . Brother  Alive  . Brother  Alive  . Brother  Alive  . Brother  Alive  . Brother  Alive  . Brother  Alive  . Brother  Alive  . Sister Elane Fritz, age 33y       myastheni gradis Dx at 67  . Sister  Alive  . Mother  Deceased       Melanoma  . PGF  Deceased  . MGF  Deceased   Family History  Problem Relation Age of  Onset  . Diabetes Father   . Cancer Sister 1       Breast cancer  . Healthy Brother   . ADD / ADHD Daughter   . Cancer Maternal Grandmother        lung  . Cancer Paternal Grandmother   . Healthy Brother   . Healthy Brother   . Healthy Brother   . Healthy Brother   . Healthy Brother   . Healthy Brother   . Alcohol abuse Brother   . Healthy Sister   . Healthy Sister    No Known Allergies     Medications: Outpatient Medications Prior to Visit  Medication Sig  . citalopram (CELEXA) 20 MG tablet TAKE 1 TABLET BY MOUTH EVERY DAY  . pantoprazole (PROTONIX) 40 MG tablet Take 1 tablet (40 mg total) by mouth 2 (two) times daily before a meal.  . phentermine 37.5 MG capsule Take 1 capsule (37.5 mg total) by mouth every morning.  . triamterene-hydrochlorothiazide (DYAZIDE) 37.5-25 MG capsule Take 1 each (1 capsule total) by mouth daily.   No facility-administered medications prior to visit.    Review of Systems  Constitutional: Negative for diaphoresis, fever and malaise/fatigue.  Cardiovascular: Positive for syncope. Negative for chest pain and palpitations.  Gastrointestinal: Negative for abdominal pain, bowel incontinence, nausea and vomiting.  Genitourinary: Negative for bladder incontinence.  Musculoskeletal: Negative for back pain.  Neurological: Positive for light-headedness. Negative for dizziness, vertigo, focal weakness, weakness and headaches.  Psychiatric/Behavioral: Positive for confusion.    Last CBC Lab Results  Component Value Date   WBC 6.5 10/25/2019   HGB 14.5 10/25/2019   HCT 44.4 10/25/2019   MCV 85 10/25/2019   MCH 27.8 10/25/2019   RDW 13.1 10/25/2019   PLT 221 10/25/2019   Last metabolic panel Lab Results  Component Value Date   GLUCOSE 92 10/25/2019   NA 136 10/25/2019   K 4.2 10/25/2019   CL 98 10/25/2019   CO2 25 10/25/2019   BUN 12 10/25/2019   CREATININE 0.75 (L) 10/25/2019   GFRNONAA 116 10/25/2019   GFRAA 134 10/25/2019   CALCIUM  9.1 10/25/2019   PROT 7.1 10/25/2019   ALBUMIN 4.5 10/25/2019   LABGLOB 2.6 10/25/2019   AGRATIO 1.7 10/25/2019   BILITOT 0.5 10/25/2019   ALKPHOS 61 10/25/2019   AST 16 10/25/2019   ALT 16 10/25/2019   Last lipids Lab Results  Component Value Date   CHOL 160 10/25/2019   HDL 29 (L) 10/25/2019   LDLCALC 83 10/25/2019   LDLDIRECT 75 03/24/2015   TRIG 290 (H) 10/25/2019   CHOLHDL 5.5 (H) 10/25/2019   Last hemoglobin A1c No  results found for: HGBA1C Last thyroid functions Lab Results  Component Value Date   TSH 1.370 10/25/2019   Last vitamin D No results found for: 25OHVITD2, 25OHVITD3, VD25OH Last vitamin B12 and Folate No results found for: VITAMINB12, FOLATE     Objective    BP 129/88   Pulse 76   Temp 98.1 F (36.7 C) (Oral)   Resp 16   SpO2 97%  BP Readings from Last 3 Encounters:  06/09/20 129/88  05/15/20 127/84  04/15/20 131/83   Wt Readings from Last 3 Encounters:  05/15/20 295 lb (133.8 kg)  04/15/20 294 lb 11.2 oz (133.7 kg)  01/03/20 281 lb (127.5 kg)       Physical Exam Vitals and nursing note reviewed.  Constitutional:      General: He is active. He is not in acute distress.    Appearance: Normal appearance. He is well-developed and well-nourished. He is not ill-appearing, toxic-appearing, sickly-appearing or diaphoretic.     Comments: Patient is alert and oriented and responsive to questions Engages in eye contact with provider. Speaks in full sentences without any pauses without any shortness of breath or distress.    HENT:     Head: Normocephalic and atraumatic.     Right Ear: Hearing, tympanic membrane, ear canal and external ear normal.     Left Ear: Hearing, tympanic membrane, ear canal and external ear normal.     Nose: Nose normal.     Mouth/Throat:     Mouth: Oropharynx is clear and moist and mucous membranes are normal.     Pharynx: Uvula midline. No oropharyngeal exudate.  Eyes:     General: Lids are normal. No scleral  icterus.       Right eye: No discharge.        Left eye: No discharge.     Extraocular Movements: EOM normal.     Conjunctiva/sclera: Conjunctivae normal.     Pupils: Pupils are equal, round, and reactive to light.  Neck:     Thyroid: No thyromegaly.     Vascular: Normal carotid pulses. No carotid bruit, hepatojugular reflux or JVD.     Trachea: Trachea and phonation normal. No tracheal tenderness or tracheal deviation.     Meningeal: Brudzinski's sign absent.  Cardiovascular:     Rate and Rhythm: Normal rate and regular rhythm.     Pulses: Normal pulses and intact distal pulses.     Heart sounds: Normal heart sounds, S1 normal and S2 normal. Heart sounds not distant. No murmur heard. No friction rub. No gallop.   Pulmonary:     Effort: Pulmonary effort is normal. No accessory muscle usage or respiratory distress.     Breath sounds: Normal breath sounds. No stridor. No wheezing, rhonchi or rales.  Chest:     Chest wall: No tenderness.  Abdominal:     General: Bowel sounds are normal. There is no distension. Aorta is normal.     Palpations: Abdomen is soft. There is no mass.     Tenderness: There is no abdominal tenderness. There is no guarding or rebound.     Hernia: No hernia is present.  Musculoskeletal:        General: No tenderness, deformity or edema. Normal range of motion.     Cervical back: Full passive range of motion without pain, normal range of motion and neck supple.     Comments:    Lymphadenopathy:     Head:     Right side of head: No  submental, submandibular, tonsillar, preauricular, posterior auricular or occipital adenopathy.     Left side of head: No submental, submandibular, tonsillar, preauricular, posterior auricular or occipital adenopathy.     Cervical: No cervical adenopathy.     Upper Body:  No axillary adenopathy present. Skin:    General: Skin is warm, dry and intact.     Capillary Refill: Capillary refill takes less than 2 seconds.     Coloration:  Skin is not pale.     Findings: No erythema or rash.     Nails: There is no clubbing or cyanosis.  Neurological:     General: No focal deficit present.     Mental Status: He is alert and oriented to person, place, and time.     GCS: GCS eye subscore is 4. GCS verbal subscore is 5. GCS motor subscore is 6.     Cranial Nerves: No cranial nerve deficit.     Sensory: Sensation is intact. No sensory deficit.     Motor: Motor function is intact. No weakness or abnormal muscle tone.     Coordination: He displays a negative Romberg sign. Coordination is intact. Coordination normal.     Gait: Gait normal.     Deep Tendon Reflexes: Strength normal and reflexes are normal and symmetric. Reflexes normal.     Reflex Scores:      Tricep reflexes are 2+ on the right side and 2+ on the left side.      Patellar reflexes are 2+ on the right side and 2+ on the left side. Psychiatric:        Mood and Affect: Mood and affect normal.        Speech: Speech normal.        Behavior: Behavior normal.        Thought Content: Thought content normal.        Cognition and Memory: Cognition and memory normal.        Judgment: Judgment normal.       No results found for any visits on 06/09/20.  Assessment & Plan     The primary encounter diagnosis was Syncope, unspecified syncope type. Diagnoses of Decreased oral intake and Episodic lightheadedness were also pertinent to this visit.   Recent dietary changes, will check CBC and CMP today, he also was recently started on Phentermine, has cut out all sodas and decreased portionis.  Suspect hypoglycemia episode. He did not take Phentermine today, he has had no further lightheadedness prior to 2/21 or after this episode.  He feels well today and denies any symptoms.  EKG was reviewed and sent to Dr. Sullivan LoneGilbert for review. Normal sinus Rhythm, compares to prior no significant change.   Patient encouraged to eat regular snacks, hydrate and check blood sugar if  lightheadedness occurs.  Did discuss neurology/ cardiac referrals, they ( patient and wife ) prefer to monitor and return in one week for follow up with PCP.  Report any new episodes advised- call 911.   Red Flags discussed. The patient was given clear instructions to go to ER or return to medical center if any red flags develop, symptoms do not improve, worsen or new problems develop. They verbalized understanding. He did request a IGG covid antibodies/ ordered.  Return in about 1 week (around 06/16/2020), or if symptoms worsen or fail to improve, for at any time for any worsening symptoms, Go to Emergency room/ urgent care if worse.     The entirety of the information documented in the  History of Present Illness, Review of Systems and Physical Exam were personally obtained by me. Portions of this information were initially documented by the CMA and reviewed by me for thoroughness and accuracy.      Jairo Ben, FNP  [facility] [phone] (phone) [phone] (fax)  [facility]

## 2020-06-09 NOTE — Telephone Encounter (Signed)
Noted.   Benjamin Ritter Duster he was recently started on phentermine for weight loss.  May need to check BP and EKG. Make sure patient is eating and drinking enough.  Thank you for seeing him since I do not have availability today.

## 2020-06-09 NOTE — Patient Instructions (Signed)
Syncope Syncope is when you pass out (faint) for a short time. It is caused by a sudden decrease in blood flow to the brain. Signs that you may be about to pass out include:  Feeling dizzy or light-headed.  Feeling sick to your stomach (nauseous).  Seeing all white or all black.  Having cold, clammy skin. If you pass out, get help right away. Call your local emergency services (911 in the [place] Do not drive yourself to the hospital. Follow these instructions at home: Watch for any changes in your symptoms. Take these actions to stay safe and help with your symptoms: Lifestyle  Do not drive, use machinery, or play sports until your doctor says it is okay.  Do not drink alcohol.  Do not use any products that contain nicotine or tobacco, such as cigarettes and e-cigarettes. If you need help quitting, ask your doctor.  Drink enough fluid to keep your pee (urine) pale yellow. General instructions  Take over-the-counter and prescription medicines only as told by your doctor.  If you are taking blood pressure or heart medicine, sit up and stand up slowly. Spend a few minutes getting ready to sit and then stand. This can help you feel less dizzy.  Have someone stay with you until you feel stable.  If you start to feel like you might pass out, lie down right away and raise (elevate) your feet above the level of your heart. Breathe deeply and steadily. Wait until all of the symptoms are gone.  Keep all follow-up visits as told by your doctor. This is important. Get help right away if:  You have a very bad headache.  You pass out once or more than once.  You have pain in your chest, belly, or back.  You have a very fast or uneven heartbeat (palpitations).  It hurts to breathe.  You are bleeding from your mouth or your bottom (rectum).  You have black or tarry poop (stool).  You have jerky movements that you cannot control (seizure).  You are confused.  You have trouble  walking.  You are very weak.  You have vision problems. These symptoms may be an emergency. Do not wait to see if the symptoms will go away. Get medical help right away. Call your local emergency services (911 in the [place] Do not drive yourself to the hospital. Summary  Syncope is when you pass out (faint) for a short time. It is caused by a sudden decrease in blood flow to the brain.  Signs that you may be about to faint include feeling dizzy, light-headed, or sick to your stomach, seeing all white or all black, or having cold, clammy skin.  If you start to feel like you might pass out, lie down right away and raise (elevate) your feet above the level of your heart. Breathe deeply and steadily. Wait until all of the symptoms are gone. This information is not intended to replace advice given to you by your health care provider. Make sure you discuss any questions you have with your health care provider. Document Revised: 05/15/2019 Document Reviewed: 05/17/2017 Elsevier Patient Education  2021 [employer] Inc[employer]  

## 2020-06-09 NOTE — Telephone Encounter (Signed)
Pt came into the office this morning to report he past out in his bathroom last night.  He states he felt a "little lightheaded" all day yesterday. When he got up to use the bathroom he remembers turning around from the toilet and his next memory was his wife trying to wake him up.  Mrs. Marotto says she heard a loud crash and when she went into the bathroom to check on him.  She found him on the floor unresponsive with his "eyes wide open and glazed over." She says pt was"out" for a few minutes. Once a came responsive he seemed confused at first but improved to baseline fairly quickly.    They did not call 911 or go the ED last night.  He states he feels fine today but would like to make an appointment today to get checked out.  Pt right now denies, dizziness, headaches, chest pain, shortness of breath and he states he is no longer lightheaded.  I scheduled him with Marcelino Duster Flinchum at 3:20pm this afternoon.  I advised both Mr. And Mrs Schoenberg that if he start having new or worsening symptoms he is to go straight to the ER.  Mrs. Cody verbalized understanding and agrees.  Mr. Sopko verbalized understanding but states he will not go to the ER.   Thanks,   -Vernona Rieger

## 2020-06-09 NOTE — Telephone Encounter (Signed)
Noted. Thanks Antony Contras, will do.

## 2020-06-10 LAB — COMPREHENSIVE METABOLIC PANEL
ALT: 17 IU/L (ref 0–44)
AST: 20 IU/L (ref 0–40)
Albumin/Globulin Ratio: 1.8 (ref 1.2–2.2)
Albumin: 5.1 g/dL — ABNORMAL HIGH (ref 4.0–5.0)
Alkaline Phosphatase: 70 IU/L (ref 44–121)
BUN/Creatinine Ratio: 17 (ref 9–20)
BUN: 29 mg/dL — ABNORMAL HIGH (ref 6–20)
Bilirubin Total: 0.3 mg/dL (ref 0.0–1.2)
CO2: 25 mmol/L (ref 20–29)
Calcium: 9.6 mg/dL (ref 8.7–10.2)
Chloride: 95 mmol/L — ABNORMAL LOW (ref 96–106)
Creatinine, Ser: 1.74 mg/dL — ABNORMAL HIGH (ref 0.76–1.27)
GFR calc Af Amer: 56 mL/min/{1.73_m2} — ABNORMAL LOW (ref >59)
GFR calc non Af Amer: 48 mL/min/{1.73_m2} — ABNORMAL LOW (ref >59)
Globulin, Total: 2.9 g/dL (ref 1.5–4.5)
Glucose: 98 mg/dL (ref 65–99)
Potassium: 3.8 mmol/L (ref 3.5–5.2)
Sodium: 139 mmol/L (ref 134–144)
Total Protein: 8 g/dL (ref 6.0–8.5)

## 2020-06-10 LAB — CBC WITH DIFFERENTIAL/PLATELET
Basophils Absolute: 0 10*3/uL (ref 0.0–0.2)
Basos: 0 %
EOS (ABSOLUTE): 0.1 10*3/uL (ref 0.0–0.4)
Eos: 1 %
Hematocrit: 45.7 % (ref 37.5–51.0)
Hemoglobin: 15.2 g/dL (ref 13.0–17.7)
Immature Grans (Abs): 0 10*3/uL (ref 0.0–0.1)
Immature Granulocytes: 0 %
Lymphocytes Absolute: 2.8 10*3/uL (ref 0.7–3.1)
Lymphs: 28 %
MCH: 27.6 pg (ref 26.6–33.0)
MCHC: 33.3 g/dL (ref 31.5–35.7)
MCV: 83 fL (ref 79–97)
Monocytes Absolute: 1.1 10*3/uL — ABNORMAL HIGH (ref 0.1–0.9)
Monocytes: 11 %
Neutrophils Absolute: 5.8 10*3/uL (ref 1.4–7.0)
Neutrophils: 60 %
Platelets: 237 10*3/uL (ref 150–450)
RBC: 5.5 x10E6/uL (ref 4.14–5.80)
RDW: 13.2 % (ref 11.6–15.4)
WBC: 9.9 10*3/uL (ref 3.4–10.8)

## 2020-06-10 LAB — SAR COV2 SEROLOGY (COVID19)AB(IGG),IA
SARS-CoV-2 Semi-Quant IgG Ab: 164 AU/mL (ref <13.0)
SARS-CoV-2 Spike Ab Interp: POSITIVE

## 2020-06-10 NOTE — Progress Notes (Signed)
CBC okay. CMP shows decreased kidney function, suspect dehydration, increase fluids and keep 06/15/20 follow up with PCP next week for recheck CMP.  Already scheduled. Return sooner if any symptoms worsen, change or return at anytime and emergent symptoms were discussed at office visit.   Please verify if patient is also still taking Triamterine -HCTZ ?   Covid antibodies IGG positive( patient requested)

## 2020-06-10 NOTE — Progress Notes (Signed)
CBC okay. CMP shows decreased kidney function, suspect dehydration, increase fluids and keep 06/15/20 follow up with PCP next week for recheck CMP.  Already scheduled. Return sooner if any symptoms worsen, change or return at anytime and emergent symptoms were discussed at office visit.   Please verify if patient is also still taking Triamterine -HCTZ ?   ovid antibodies IGG positive( patient requested)

## 2020-06-15 ENCOUNTER — Other Ambulatory Visit: Payer: Self-pay

## 2020-06-15 ENCOUNTER — Encounter: Payer: Self-pay | Admitting: Physician Assistant

## 2020-06-15 ENCOUNTER — Ambulatory Visit: Payer: PRIVATE HEALTH INSURANCE | Admitting: Physician Assistant

## 2020-06-15 VITALS — BP 138/84 | HR 99 | Temp 98.0°F | Ht 74.0 in | Wt 280.0 lb

## 2020-06-15 DIAGNOSIS — Z6835 Body mass index (BMI) 35.0-35.9, adult: Secondary | ICD-10-CM | POA: Diagnosis not present

## 2020-06-15 NOTE — Progress Notes (Signed)
Established patient visit   Patient: Benjamin Ritter   DOB: 04-13-1981   40 y.o. Male  MRN: [id number] Visit Date: 06/15/2020  Today's healthcare provider: [name], PA-C   Chief Complaint  Patient presents with  . Obesity   Subjective    HPI  Follow up for Obesity  The patient was last seen for this 4 weeks ago. Changes made at last visit include starting phentermine.  He reports excellent compliance with treatment. He feels that condition is Improved. He is not having side effects.   He has also increased his water intake since his syncopal event. He reports he is feeling so much better, and has had none since. -----------------------------------------------------------------------------------------    Patient Active Problem List   Diagnosis Date Noted  . Decreased oral intake 06/09/2020  . Syncope 06/09/2020  . Episodic lightheadedness 06/09/2020  . Obese 10/22/2019   Past Medical History:  Diagnosis Date  . H/O heartburn   . Transient elevated blood pressure    Social History   Tobacco Use  . Smoking status: Never Smoker  . Smokeless tobacco: Never Used  Substance Use Topics  . Alcohol use: Yes  . Drug use: No   No Known Allergies   Medications: Outpatient Medications Prior to Visit  Medication Sig  . citalopram (CELEXA) 20 MG tablet TAKE 1 TABLET BY MOUTH EVERY DAY  . pantoprazole (PROTONIX) 40 MG tablet Take 1 tablet (40 mg total) by mouth 2 (two) times daily before a meal.  . phentermine 37.5 MG capsule Take 1 capsule (37.5 mg total) by mouth every morning.  . triamterene-hydrochlorothiazide (DYAZIDE) 37.5-25 MG capsule Take 1 each (1 capsule total) by mouth daily.   No facility-administered medications prior to visit.    Review of Systems  Constitutional: Negative.   Respiratory: Negative.   Cardiovascular: Negative.   Gastrointestinal: Negative.   Neurological: Negative for dizziness, light-headedness and headaches.         Objective    BP 138/84 (BP Location: Right Arm, Patient Position: Sitting, Cuff Size: Large)   Pulse 99   Temp 98 F (36.7 C) (Oral)   Ht 6\' 2"  (1.88 m)   Wt 280 lb (127 kg)   BMI 35.95 kg/m    Physical Exam Vitals reviewed.  Constitutional:      General: He is not in acute distress.    Appearance: Normal appearance. He is well-developed and well-nourished. He is obese. He is not ill-appearing or diaphoretic.  HENT:     Head: Normocephalic and atraumatic.  Cardiovascular:     Rate and Rhythm: Normal rate and regular rhythm.     Heart sounds: Normal heart sounds. No murmur heard. No friction rub. No gallop.   Pulmonary:     Effort: Pulmonary effort is normal. No respiratory distress.     Breath sounds: Normal breath sounds. No wheezing or rales.  Musculoskeletal:     Cervical back: Normal range of motion and neck supple.  Neurological:     Mental Status: He is alert.      No results found for any visits on 06/15/20.  Assessment & Plan     1. Class 2 severe obesity due to excess calories with serious comorbidity and body mass index (BMI) of 35.0 to 35.9 in adult [facility]) Patient continues to do well and has lost 15 pounds in the first month with phentermine. He did have episode of dehydration with syncope. Since has been increasing fluids and is doing well. He  will f/u in 2-3 months for weight with Dr. Sullivan Lone in the interim and will transition to one of the new providers once they on board. He is in agreement with this plan.    Return in about 2 months (around 08/13/2020) for weight.      Delmer Islam, PA-C, have reviewed all documentation for this visit. The documentation on 06/15/20 for the exam, diagnosis, procedures, and orders are all accurate and complete.   Reine Just  [facility] [phone] (phone) [phone] (fax)  [facility]

## 2020-06-27 ENCOUNTER — Other Ambulatory Visit: Payer: Self-pay | Admitting: Physician Assistant

## 2020-06-27 DIAGNOSIS — K219 Gastro-esophageal reflux disease without esophagitis: Secondary | ICD-10-CM

## 2020-08-19 ENCOUNTER — Other Ambulatory Visit: Payer: Self-pay | Admitting: Physician Assistant

## 2020-08-19 DIAGNOSIS — Z6837 Body mass index (BMI) 37.0-37.9, adult: Secondary | ICD-10-CM

## 2020-08-19 DIAGNOSIS — K219 Gastro-esophageal reflux disease without esophagitis: Secondary | ICD-10-CM

## 2020-08-20 ENCOUNTER — Other Ambulatory Visit: Payer: Self-pay | Admitting: Family Medicine

## 2020-08-20 DIAGNOSIS — K219 Gastro-esophageal reflux disease without esophagitis: Secondary | ICD-10-CM

## 2020-08-20 MED ORDER — PANTOPRAZOLE SODIUM 40 MG PO TBEC: 40.0000 mg | DELAYED_RELEASE_TABLET | Freq: Two times a day (BID) | ORAL | 1 refills | Status: DC

## 2020-08-20 NOTE — Telephone Encounter (Signed)
  Notes to clinic:  Alternative Requested:INSURANCE LIMITS 1 TABLET PER DAY. WOULD YOU LIKE TO CONTACT INSURANCE TO GET LIMIT OVERRIDE? THANKS!1.   Requested Prescriptions  Pending Prescriptions Disp Refills   pantoprazole (PROTONIX) 40 MG tablet [Pharmacy Med Name: PANTOPRAZOLE SOD DR 40 MG TAB] 180 tablet 1    Sig: Take 1 tablet (40 mg total) by mouth 2 (two) times daily before a meal.      Gastroenterology: Proton Pump Inhibitors Passed - 08/20/2020  9:57 AM      Passed - Valid encounter within last 12 months    Recent Outpatient Visits           2 months ago Class 2 severe obesity due to excess calories with serious comorbidity and body mass index (BMI) of 35.0 to 35.9 in adult [facility])   [facility] [place], [place], PA-C   2 months ago Syncope, unspecified syncope type   [facility] [place], Eula Fried, FNP   3 months ago Class 2 severe obesity due to excess calories with serious comorbidity and body mass index (BMI) of 37.0 to 37.9 in adult [facility])   [facility] [place], [place], PA-C   4 months ago GAD (generalized anxiety disorder)   [facility] [place], [place], [place]   7 months ago Vertigo   [facility] [place], Alessandra [place], [place]       Future Appointments             In 1 month Maple Hudson., MD [facility], [facility]

## 2020-08-20 NOTE — Telephone Encounter (Signed)
Requested medication (s) are due for refill today: yes  Requested medication (s) are on the active medication list: yes  Last refill: 07/21/2020  Future visit scheduled: yes  Notes to clinic:  this refill cannot be delegated    Requested Prescriptions  Pending Prescriptions Disp Refills   phentermine 37.5 MG capsule [Pharmacy Med Name: PHENTERMINE 37.5 MG CAPSULE] 30 capsule 2    Sig: TAKE 1 CAPSULE BY MOUTH EVERY MORNING      Not Delegated - Gastroenterology:  Antiobesity Agents Failed - 08/19/2020  9:48 PM      Failed - This refill cannot be delegated      Passed - Last BP in normal range    BP Readings from Last 1 Encounters:  06/15/20 138/84          Passed - Last Heart Rate in normal range    Pulse Readings from Last 1 Encounters:  06/15/20 99          Passed - Valid encounter within last 12 months    Recent Outpatient Visits           2 months ago Class 2 severe obesity due to excess calories with serious comorbidity and body mass index (BMI) of 35.0 to 35.9 in adult [facility])   [facility] [place], [place], [place]   2 months ago Syncope, unspecified syncope type   [facility] Fl[place], Eula Fried, FNP   3 months ago Class 2 severe obesity due to excess calories with serious comorbidity and body mass index (BMI) of 37.0 to 37.9 in adult [facility])   [facility] [place], [place], PA-C   4 months ago GAD (generalized anxiety disorder)   [facility] [place], [place], [place]   7 months ago Vertigo   [facility] [place], [place]andra [place], [place]       Future Appointments             In 1 month Maple Hudson., MD [facility], [facility]

## 2020-08-27 MED ORDER — PANTOPRAZOLE SODIUM 40 MG PO TBEC: 40.0000 mg | DELAYED_RELEASE_TABLET | Freq: Every day | ORAL | 1 refills | Status: DC

## 2020-08-27 NOTE — Addendum Note (Signed)
Addended by: Erasmo Downer on: 08/27/2020 10:26 AM   Modules accepted: Orders

## 2020-09-21 ENCOUNTER — Ambulatory Visit: Payer: PRIVATE HEALTH INSURANCE | Admitting: Family Medicine

## 2020-09-21 ENCOUNTER — Other Ambulatory Visit: Payer: Self-pay

## 2020-09-21 ENCOUNTER — Encounter: Payer: Self-pay | Admitting: Family Medicine

## 2020-09-21 VITALS — BP 116/78 | HR 73 | Temp 98.2°F | Resp 16 | Wt 263.0 lb

## 2020-09-21 DIAGNOSIS — F411 Generalized anxiety disorder: Secondary | ICD-10-CM | POA: Diagnosis not present

## 2020-09-21 DIAGNOSIS — I10 Essential (primary) hypertension: Secondary | ICD-10-CM

## 2020-09-21 DIAGNOSIS — Z6837 Body mass index (BMI) 37.0-37.9, adult: Secondary | ICD-10-CM

## 2020-09-21 DIAGNOSIS — H8109 Meniere's disease, unspecified ear: Secondary | ICD-10-CM | POA: Diagnosis not present

## 2020-09-21 MED ORDER — PHENTERMINE HCL 37.5 MG PO CAPS: 37.5000 mg | ORAL_CAPSULE | ORAL | 4 refills | Status: DC

## 2020-09-21 NOTE — Progress Notes (Signed)
Established patient visit   Patient: Benjamin Ritter   DOB: 05/05/1980   40 y.o. Male  MRN: [id number] Visit Date: 09/21/2020  Today's healthcare provider: Megan Mans, MD   Chief Complaint  Patient presents with   Follow-up   Subjective    HPI  Patient comes in today for follow-up.  He has lost 32 pounds since the first of the year with diet and exercise but especially help with phentermine.  He is very pleased with his progress.  He feels better. He does have a history of Mnire's disease.  Presently controlled.  He is on Dyazide daily. Follow up for weight management  The patient was last seen for this 3 months ago. Changes made at last visit include no changes. Continue phentermine.   He reports excellent compliance with treatment. He feels that condition is Improved. He is not having side effects.   Wt Readings from Last 3 Encounters:  09/21/20 263 lb (119.3 kg)  06/15/20 280 lb (127 kg)  05/15/20 295 lb (133.8 kg)    -----------------------------------------------------------------------------------------  Patient Active Problem List   Diagnosis Date Noted   Decreased oral intake 06/09/2020   Syncope 06/09/2020   Episodic lightheadedness 06/09/2020   Obese 10/22/2019   Social History   Tobacco Use   Smoking status: Never Smoker   Smokeless tobacco: Never Used  Substance Use Topics   Alcohol use: Yes   Drug use: No   No Known Allergies     Medications: Outpatient Medications Prior to Visit  Medication Sig   citalopram (CELEXA) 20 MG tablet TAKE 1 TABLET BY MOUTH EVERY DAY   pantoprazole (PROTONIX) 40 MG tablet Take 1 tablet (40 mg total) by mouth daily.   phentermine 37.5 MG capsule TAKE 1 CAPSULE BY MOUTH EVERY MORNING   triamterene-hydrochlorothiazide (DYAZIDE) 37.5-25 MG capsule Take 1 each (1 capsule total) by mouth daily.   No facility-administered medications prior to visit.    Review of Systems      Objective    BP 116/78    Pulse 73   Temp 98.2 F (36.8 C) (Oral)   Resp 16   Wt 263 lb (119.3 kg)   SpO2 98%   BMI 33.77 kg/m  BP Readings from Last 3 Encounters:  09/21/20 116/78  06/15/20 138/84  06/09/20 129/88   Wt Readings from Last 3 Encounters:  09/21/20 263 lb (119.3 kg)  06/15/20 280 lb (127 kg)  05/15/20 295 lb (133.8 kg)       Physical Exam Vitals reviewed.  Constitutional:      General: He is not in acute distress.    Appearance: Normal appearance. He is well-developed. He is obese. He is not ill-appearing or diaphoretic.  HENT:     Head: Normocephalic and atraumatic.  Cardiovascular:     Rate and Rhythm: Normal rate and regular rhythm.     Heart sounds: Normal heart sounds. No murmur heard.   No friction rub. No gallop.  Pulmonary:     Effort: Pulmonary effort is normal. No respiratory distress.     Breath sounds: Normal breath sounds. No wheezing or rales.  Musculoskeletal:     Cervical back: Normal range of motion and neck supple.  Skin:    General: Skin is warm and dry.  Neurological:     General: No focal deficit present.     Mental Status: He is alert and oriented to person, place, and time.  Psychiatric:  Mood and Affect: Mood normal.        Behavior: Behavior normal.        Thought Content: Thought content normal.        Judgment: Judgment normal.      No results found for any visits on 09/21/20.  Assessment & Plan     1. Class 2 severe obesity due to excess calories with serious comorbidity and body mass index (BMI) of 37.0 to 37.9 in adult [facility]) Patient doing well with a weight loss of 32 pounds just 10% of body weight.  Continue phentermine and recheck later this summer  2. Primary hypertension Well-controlled.  3. GAD (generalized anxiety disorder) [place] stable on Celexa  4. Meniere's disease, unspecified laterality Controlled on Dyazide   No follow-ups on file. I, Megan Mans, MD, have reviewed all documentation for this visit. The  documentation on 09/25/20 for the exam, diagnosis, procedures, and orders are all accurate and complete.      I, Megan Mans, MD, have reviewed all documentation for this visit. The documentation on 09/25/20 for the exam, diagnosis, procedures, and orders are all accurate and complete.    Tatsuo Musial Wendelyn Breslow, MD  [facility] [phone] (phone) [phone] (fax)  [facility]

## 2020-12-04 ENCOUNTER — Other Ambulatory Visit: Payer: Self-pay | Admitting: Physician Assistant

## 2020-12-04 DIAGNOSIS — I10 Essential (primary) hypertension: Secondary | ICD-10-CM

## 2021-02-10 ENCOUNTER — Other Ambulatory Visit: Payer: Self-pay | Admitting: Family Medicine

## 2021-02-10 DIAGNOSIS — K219 Gastro-esophageal reflux disease without esophagitis: Secondary | ICD-10-CM

## 2021-02-10 NOTE — Telephone Encounter (Signed)
Requested Prescriptions  Pending Prescriptions Disp Refills  . pantoprazole (PROTONIX) 40 MG tablet [Pharmacy Med Name: PANTOPRAZOLE SOD DR 40 MG TAB] 90 tablet 0    Sig: TAKE 1 TABLET BY MOUTH EVERY DAY     Gastroenterology: Proton Pump Inhibitors Passed - 02/10/2021  8:50 AM      Passed - Valid encounter within last 12 months    Recent Outpatient Visits          4 months ago Class 2 severe obesity due to excess calories with serious comorbidity and body mass index (BMI) of 37.0 to 37.9 in adult [facility])   [facility] Maple Hudson., MD   8 months ago Class 2 severe obesity due to excess calories with serious comorbidity and body mass index (BMI) of 35.0 to 35.9 in adult [facility])   [facility] [place], [place], PA-C   8 months ago Syncope, unspecified syncope type   [facility] [place], Eula Fried, FNP   9 months ago Class 2 severe obesity due to excess calories with serious comorbidity and body mass index (BMI) of 37.0 to 37.9 in adult [facility])   [facility] [employer], [place], [place]   10 months ago GAD (generalized anxiety disorder)   [facility] [place], [place]andra [place], [place]      Future Appointments            In 1 month Maple Hudson., MD [facility], [facility]

## 2021-02-14 ENCOUNTER — Other Ambulatory Visit: Payer: Self-pay | Admitting: Physician Assistant

## 2021-02-14 DIAGNOSIS — F411 Generalized anxiety disorder: Secondary | ICD-10-CM

## 2021-03-24 ENCOUNTER — Encounter: Payer: Self-pay | Admitting: Family Medicine

## 2021-03-29 ENCOUNTER — Ambulatory Visit (INDEPENDENT_AMBULATORY_CARE_PROVIDER_SITE_OTHER): Payer: BC Managed Care – PPO | Admitting: Family Medicine

## 2021-03-29 ENCOUNTER — Other Ambulatory Visit: Payer: Self-pay

## 2021-03-29 ENCOUNTER — Encounter: Payer: Self-pay | Admitting: Family Medicine

## 2021-03-29 VITALS — BP 122/80 | HR 76 | Temp 98.4°F | Resp 16 | Ht 74.0 in | Wt 271.0 lb

## 2021-03-29 DIAGNOSIS — Z6837 Body mass index (BMI) 37.0-37.9, adult: Secondary | ICD-10-CM

## 2021-03-29 DIAGNOSIS — Z114 Encounter for screening for human immunodeficiency virus [HIV]: Secondary | ICD-10-CM | POA: Diagnosis not present

## 2021-03-29 DIAGNOSIS — Z0000 Encounter for general adult medical examination without abnormal findings: Secondary | ICD-10-CM

## 2021-03-29 DIAGNOSIS — E785 Hyperlipidemia, unspecified: Secondary | ICD-10-CM

## 2021-03-29 DIAGNOSIS — E782 Mixed hyperlipidemia: Secondary | ICD-10-CM | POA: Diagnosis not present

## 2021-03-29 DIAGNOSIS — E1169 Type 2 diabetes mellitus with other specified complication: Secondary | ICD-10-CM | POA: Diagnosis not present

## 2021-03-29 DIAGNOSIS — H8109 Meniere's disease, unspecified ear: Secondary | ICD-10-CM | POA: Diagnosis not present

## 2021-03-29 DIAGNOSIS — Z13228 Encounter for screening for other metabolic disorders: Secondary | ICD-10-CM | POA: Diagnosis not present

## 2021-03-29 DIAGNOSIS — F419 Anxiety disorder, unspecified: Secondary | ICD-10-CM

## 2021-03-29 MED ORDER — PHENTERMINE HCL 37.5 MG PO CAPS: 37.5000 mg | ORAL_CAPSULE | Freq: Every morning | ORAL | 2 refills | Status: DC

## 2021-03-29 NOTE — Progress Notes (Signed)
BP 122/80 (BP Location: Left Arm, Patient Position: Sitting, Cuff Size: Large)   Pulse 76   Temp 98.4 F (36.9 C) (Temporal)   Resp 16   Ht 6\' 2"  (1.88 m)   Wt 271 lb (122.9 kg)   SpO2 98%   BMI 34.79 kg/m    Subjective:    Patient ID: , male    DOB: Jul 16, 1980, 40 y.o.   MRN: [id number]  HPI: Benjamin Ritter is a 40 y.o. male presenting on 03/29/2021 for comprehensive medical examination. Current medical complaints include:none  Anxiety - Medications: celexa - Taking: good compliance - Counseling: previously - Previous hospitalizations: no - Symptoms: none - Current stressors: son recently sick, recent hospitalization  Meniere disease - on Maxzide. Doing well.   OBESITY - Meds: phentermine once daily - Previously on none. - Complications of obesity: none - Peak weight: 295 lb - Weight loss to date: currently at 271lb. - Diet: 3 meals per day. Hard to get in fruits and vegetables with work, eats a lot of fast food. Has been drinking more sweet tea. Has been snacking more recently when not on phentermine. - Exercise: walks after lunch   He currently lives with: wife, 5 kids  Depression Screen done today and results listed below:  Depression screen [facility] 2/9 03/29/2021 05/15/2020 04/15/2020 10/22/2019 07/20/2017  Decreased Interest 0 0 0 0 0  Down, Depressed, Hopeless 0 0 0 0 0  PHQ - 2 Score 0 0 0 0 0  Altered sleeping 0 0 - 0 -  Tired, decreased energy 1 1 - 0 -  Change in appetite 2 1 - 0 -  Feeling bad or failure about yourself  0 0 - 0 -  Trouble concentrating 0 0 - 0 -  Moving slowly or fidgety/restless 0 0 - 0 -  Suicidal thoughts 0 0 - 0 -  PHQ-9 Score 3 2 - 0 -  Difficult doing work/chores Not difficult at all Not difficult at all - Not difficult at all -    The patient does not have a history of falls. I did not complete a risk assessment for falls. A plan of care for falls was not documented.   Past Medical History:  Past Medical History:   Diagnosis Date   H/O heartburn    Transient elevated blood pressure     Surgical History:  Past Surgical History:  Procedure Laterality Date   NO PAST SURGERIES      Medications:  Current Outpatient Medications on File Prior to Visit  Medication Sig   citalopram (CELEXA) 20 MG tablet TAKE 1 TABLET BY MOUTH EVERY DAY   pantoprazole (PROTONIX) 40 MG tablet TAKE 1 TABLET BY MOUTH EVERY DAY   triamterene-hydrochlorothiazide (MAXZIDE-25) 37.5-25 MG tablet TAKE 1 TABLET BY MOUTH EVERY DAY (Patient not taking: Reported on 03/29/2021)   No current facility-administered medications on file prior to visit.    Allergies:  No Known Allergies  Social History:  Social History   Socioeconomic History   Marital status: Married    Spouse name: Not on file   Number of children: Not on file   Years of education: Not on file   Highest education level: Not on file  Occupational History   Not on file  Tobacco Use   Smoking status: Never   Smokeless tobacco: Never  Substance and Sexual Activity   Alcohol use: Yes   Drug use: No   Sexual activity: Yes  Other Topics Concern  Not on file  Social History Narrative   Not on file   Social Determinants of Health   Financial Resource Strain: Not on file  Food Insecurity: Not on file  Transportation Needs: Not on file  Physical Activity: Not on file  Stress: Not on file  Social Connections: Not on file  Intimate Partner Violence: Not on file   Social History   Tobacco Use  Smoking Status Never  Smokeless Tobacco Never   Social History   Substance and Sexual Activity  Alcohol Use Yes    Family History:  Family History  Problem Relation Age of Onset   Diabetes Father    Cancer Sister 73       Breast cancer   Healthy Brother    ADD / ADHD Daughter    Cancer Maternal Grandmother        lung   Cancer Paternal Grandmother    Healthy Brother    Healthy Brother    Healthy Brother    Healthy Brother    Healthy Brother     Healthy Brother    Alcohol abuse Brother    Healthy Sister    Healthy Sister     Past medical history, surgical history, medications, allergies, family history and social history reviewed with patient today and changes made to appropriate areas of the chart.      Objective:    BP 122/80 (BP Location: Left Arm, Patient Position: Sitting, Cuff Size: Large)   Pulse 76   Temp 98.4 F (36.9 C) (Temporal)   Resp 16   Ht 6\' 2"  (1.88 m)   Wt 271 lb (122.9 kg)   SpO2 98%   BMI 34.79 kg/m   Wt Readings from Last 3 Encounters:  03/29/21 271 lb (122.9 kg)  09/21/20 263 lb (119.3 kg)  06/15/20 280 lb (127 kg)    Physical Exam Vitals reviewed.  Constitutional:      Appearance: He is obese.  HENT:     Head: Normocephalic.     Right Ear: External ear normal.     Left Ear: External ear normal.  Eyes:     Extraocular Movements: Extraocular movements intact.  Cardiovascular:     Rate and Rhythm: Normal rate and regular rhythm.     Heart sounds: Normal heart sounds. No murmur heard. Pulmonary:     Effort: No respiratory distress.     Breath sounds: Normal breath sounds.  Musculoskeletal:        General: Normal range of motion.     Cervical back: Normal range of motion.     Right lower leg: No edema.     Left lower leg: No edema.  Skin:    General: Skin is warm and dry.  Neurological:     Mental Status: He is alert and oriented to person, place, and time. Mental status is at baseline.  Psychiatric:        Mood and Affect: Mood normal.        Behavior: Behavior normal.    Results for orders placed or performed in visit on 06/09/20  CBC with Differential/Platelet  Result Value Ref Range   WBC 9.9 3.4 - 10.8 x10E3/uL   RBC 5.50 4.14 - 5.80 x10E6/uL   Hemoglobin 15.2 13.0 - 17.7 g/dL   Hematocrit 45.7 37.5 - 51.0 %   MCV 83 79 - 97 fL   MCH 27.6 26.6 - 33.0 pg   MCHC 33.3 31.5 - 35.7 g/dL   RDW 13.2 11.6 - 15.4 %  Platelets 237 150 - 450 x10E3/uL   Neutrophils 60 Not  Estab. %   Lymphs 28 Not Estab. %   Monocytes 11 Not Estab. %   Eos 1 Not Estab. %   Basos 0 Not Estab. %   Neutrophils Absolute 5.8 1.4 - 7.0 x10E3/uL   Lymphocytes Absolute 2.8 0.7 - 3.1 x10E3/uL   Monocytes Absolute 1.1 (H) 0.1 - 0.9 x10E3/uL   EOS (ABSOLUTE) 0.1 0.0 - 0.4 x10E3/uL   Basophils Absolute 0.0 0.0 - 0.2 x10E3/uL   Immature Granulocytes 0 Not Estab. %   Immature Grans (Abs) 0.0 0.0 - 0.1 x10E3/uL  Comprehensive Metabolic Panel (CMET)  Result Value Ref Range   Glucose 98 65 - 99 mg/dL   BUN 29 (H) 6 - 20 mg/dL   Creatinine, Ser 1.74 (H) 0.76 - 1.27 mg/dL   GFR calc non Af Amer 48 (L) >59 mL/min/1.73   GFR calc Af Amer 56 (L) >59 mL/min/1.73   BUN/Creatinine Ratio 17 9 - 20   Sodium 139 134 - 144 mmol/L   Potassium 3.8 3.5 - 5.2 mmol/L   Chloride 95 (L) 96 - 106 mmol/L   CO2 25 20 - 29 mmol/L   Calcium 9.6 8.7 - 10.2 mg/dL   Total Protein 8.0 6.0 - 8.5 g/dL   Albumin 5.1 (H) 4.0 - 5.0 g/dL   Globulin, Total 2.9 1.5 - 4.5 g/dL   Albumin/Globulin Ratio 1.8 1.2 - 2.2   Bilirubin Total 0.3 0.0 - 1.2 mg/dL   Alkaline Phosphatase 70 44 - 121 IU/L   AST 20 0 - 40 IU/L   ALT 17 0 - 44 IU/L  SAR CoV2 Serology (COVID 19)AB(IGG)IA  Result Value Ref Range   SARS-CoV-2 Semi-Quant IgG Ab 164.0 Neg <13.0 AU/mL   SARS-CoV-2 Spike Ab Interp Positive       Assessment & Plan:   Problem List Items Addressed This Visit       Nervous and Auditory   Meniere disease    Doing well on current regimen, no changes made today.        Other   Morbid obesity ([place])    Contributing to HLD. Referred to nutritionist. Continue phentermine, considered GLP1 agonist but unlikely to get insurance coverage. Could also consider switching to buproprion-naltrexone in the future if weight loss adjunct still needed. Work on diet and exercise. Obtaining labs today. F/u in 6 months.      Relevant Medications   phentermine 37.5 MG capsule   Other Relevant Orders   Amb ref to Medical Nutrition  Therapy-MNT   Anxiety - Primary    Doing well on current regimen, no changes made today.      Other Visit Diagnoses     Annual physical exam       Relevant Orders   Hepatitis C antibody   HIV antibody (with reflex)   Comprehensive metabolic panel   Lipid panel   CBC with Differential   Hemoglobin A1c   Hyperlipidemia associated with type 2 diabetes mellitus ([place])       Hyperlipidemia, unspecified hyperlipidemia type       Relevant Orders   Amb ref to Medical Nutrition Therapy-MNT        LABORATORY TESTING:  Health maintenance labs ordered today as discussed above.    IMMUNIZATIONS:   - Tdap: Tetanus vaccination status reviewed: last tetanus booster within 10 years. - Influenza: Refused - Pneumococcal: Not applicable - HPV: Not applicable - Shingrix vaccine: Not applicable - COVID vaccine: has  received 3 doses of mRNA vaccine  SCREENING: - Colonoscopy: Not applicable  Discussed with patient purpose of the colonoscopy is to detect colon cancer at curable precancerous or early stages   - AAA Screening: Not applicable  - Lung cancer screening: n/a  Hep C Screening: due STD testing and prevention (HIV/chl/gon/syphilis): due Sexual History: Incontinence Symptoms:   PATIENT COUNSELING:    Advanced Care Planning: A voluntary discussion about advance care planning including the explanation and discussion of advance directives.  Discussed health care proxy and Living will, and the patient was able to identify a health care proxy as wife, Bentlee Packett.  Patient does have a living will at present time. If patient does have living will, I have requested they bring this to the clinic to be scanned in to their chart.  Sexuality: Discussed sexually transmitted diseases, partner selection, use of condoms, avoidance of unintended pregnancy  and contraceptive alternatives.   Advised to avoid cigarette smoking.  I discussed with the patient that most people either abstain from  alcohol or drink within safe limits (<=14/week and <=4 drinks/occasion for males, <=7/weeks and <= 3 drinks/occasion for females) and that the risk for alcohol disorders and other health effects rises proportionally with the number of drinks per week and how often a drinker exceeds daily limits.  Discussed cessation/primary prevention of drug use and availability of treatment for abuse.   Diet: Encouraged to adjust caloric intake to maintain  or achieve ideal body weight, to reduce intake of dietary saturated fat and total fat, to limit sodium intake by avoiding high sodium foods and not adding table salt, and to maintain adequate dietary potassium and calcium preferably from fresh fruits, vegetables, and low-fat dairy products.    stressed the importance of regular exercise  Injury prevention: Discussed safety belts, safety helmets, smoke detector, smoking near bedding or upholstery.   Dental health: Discussed importance of regular tooth brushing, flossing, and dental visits.   Follow up plan: NEXT PREVENTATIVE PHYSICAL DUE IN 1 YEAR. Return in about 6 months (around 09/27/2021).

## 2021-03-29 NOTE — Assessment & Plan Note (Signed)
Doing well on current regimen, no changes made today. 

## 2021-03-29 NOTE — Assessment & Plan Note (Addendum)
Contributing to HLD. Referred to nutritionist. Continue phentermine, considered GLP1 agonist but unlikely to get insurance coverage. Could also consider switching to buproprion-naltrexone in the future if weight loss adjunct still needed. Work on diet and exercise. Obtaining labs today. F/u in 6 months.

## 2021-03-29 NOTE — Patient Instructions (Signed)

## 2021-03-30 LAB — COMPREHENSIVE METABOLIC PANEL
ALT: 17 IU/L (ref 0–44)
AST: 16 IU/L (ref 0–40)
Albumin/Globulin Ratio: 1.7 (ref 1.2–2.2)
Albumin: 4.7 g/dL (ref 4.0–5.0)
Alkaline Phosphatase: 62 IU/L (ref 44–121)
BUN/Creatinine Ratio: 14 (ref 9–20)
BUN: 10 mg/dL (ref 6–24)
Bilirubin Total: 0.3 mg/dL (ref 0.0–1.2)
CO2: 26 mmol/L (ref 20–29)
Calcium: 9.5 mg/dL (ref 8.7–10.2)
Chloride: 100 mmol/L (ref 96–106)
Creatinine, Ser: 0.71 mg/dL — ABNORMAL LOW (ref 0.76–1.27)
Globulin, Total: 2.8 g/dL (ref 1.5–4.5)
Glucose: 102 mg/dL — ABNORMAL HIGH (ref 70–99)
Potassium: 4.6 mmol/L (ref 3.5–5.2)
Sodium: 139 mmol/L (ref 134–144)
Total Protein: 7.5 g/dL (ref 6.0–8.5)
eGFR: 119 mL/min/{1.73_m2} (ref >59)

## 2021-03-30 LAB — LIPID PANEL
Chol/HDL Ratio: 4.7 ratio (ref 0.0–5.0)
Cholesterol, Total: 166 mg/dL (ref 100–199)
HDL: 35 mg/dL — ABNORMAL LOW (ref >39)
LDL Chol Calc (NIH): 95 mg/dL (ref 0–99)
Triglycerides: 208 mg/dL — ABNORMAL HIGH (ref 0–149)
VLDL Cholesterol Cal: 36 mg/dL (ref 5–40)

## 2021-03-30 LAB — CBC WITH DIFFERENTIAL/PLATELET
Basophils Absolute: 0.1 10*3/uL (ref 0.0–0.2)
Basos: 1 %
EOS (ABSOLUTE): 0.1 10*3/uL (ref 0.0–0.4)
Eos: 2 %
Hematocrit: 47.2 % (ref 37.5–51.0)
Hemoglobin: 15.7 g/dL (ref 13.0–17.7)
Immature Grans (Abs): 0 10*3/uL (ref 0.0–0.1)
Immature Granulocytes: 0 %
Lymphocytes Absolute: 2.3 10*3/uL (ref 0.7–3.1)
Lymphs: 34 %
MCH: 27.9 pg (ref 26.6–33.0)
MCHC: 33.3 g/dL (ref 31.5–35.7)
MCV: 84 fL (ref 79–97)
Monocytes Absolute: 0.5 10*3/uL (ref 0.1–0.9)
Monocytes: 7 %
Neutrophils Absolute: 3.9 10*3/uL (ref 1.4–7.0)
Neutrophils: 56 %
Platelets: 239 10*3/uL (ref 150–450)
RBC: 5.62 x10E6/uL (ref 4.14–5.80)
RDW: 13 % (ref 11.6–15.4)
WBC: 6.9 10*3/uL (ref 3.4–10.8)

## 2021-03-30 LAB — HEMOGLOBIN A1C
Est. average glucose Bld gHb Est-mCnc: 114 mg/dL
Hgb A1c MFr Bld: 5.6 % (ref 4.8–5.6)

## 2021-03-30 LAB — HIV ANTIBODY (ROUTINE TESTING W REFLEX): HIV Screen 4th Generation wRfx: NONREACTIVE

## 2021-03-30 LAB — HEPATITIS C ANTIBODY: Hep C Virus Ab: <0.1 s/co ratio (ref 0.0–0.9)

## 2021-05-03 ENCOUNTER — Other Ambulatory Visit: Payer: Self-pay | Admitting: Family Medicine

## 2021-05-03 DIAGNOSIS — K219 Gastro-esophageal reflux disease without esophagitis: Secondary | ICD-10-CM

## 2021-05-03 NOTE — Telephone Encounter (Signed)
Requested Prescriptions  Pending Prescriptions Disp Refills   pantoprazole (PROTONIX) 40 MG tablet [Pharmacy Med Name: PANTOPRAZOLE SOD DR 40 MG TAB] 90 tablet 1    Sig: TAKE 1 TABLET BY MOUTH EVERY DAY     Gastroenterology: Proton Pump Inhibitors Passed - 05/03/2021  3:57 PM      Passed - Valid encounter within last 12 months    Recent Outpatient Visits          1 month ago Anxiety   [facility] Caro Laroche, DO   7 months ago Class 2 severe obesity due to excess calories with serious comorbidity and body mass index (BMI) of 37.0 to 37.9 in adult [facility])   [facility] Maple Hudson., MD   10 months ago Class 2 severe obesity due to excess calories with serious comorbidity and body mass index (BMI) of 35.0 to 35.9 in adult [facility])   [facility] [place], [place], [place]   10 months ago Syncope, unspecified syncope type   [facility] Flinchum, Eula Fried, FNP   11 months ago Class 2 severe obesity due to excess calories with serious comorbidity and body mass index (BMI) of 37.0 to 37.9 in adult [facility])   [facility] [place], Alessandra [place], [place]      Future Appointments            In 4 months Maple Hudson., MD [facility]   In 11 months Maple Hudson., MD [facility], [facility]

## 2021-09-13 ENCOUNTER — Encounter: Payer: Self-pay | Admitting: Family Medicine

## 2021-09-23 NOTE — Progress Notes (Signed)
Established patient visit  I,[name],acting as a scribe for Benjamin Durie, MD.,have documented all relevant documentation on the behalf of Benjamin Durie, MD,as directed by  Benjamin Durie, MD while in the presence of Benjamin Durie, MD.   Patient: Benjamin Ritter   DOB: Sep 25, 1980   41 y.o. Male  MRN: [id number] Visit Date: 09/27/2021  Today's healthcare provider: Wilhemena Durie, MD   Chief Complaint  Patient presents with   Follow-up   Anxiety   Subjective    HPI  Patient is doing well from an anxiety standpoint but is been doing poorly with weight. He has not been doing well with diet and exercise and has gained weight.  He states when he takes the phentermine he is able to lose weight well.  Overall he has no complaints.  Anxiety, Follow-up  He was last seen for anxiety 6 months ago. Changes made at last visit include; citalopram 20 mg.  GAD-7 Results    09/27/2021    8:14 AM 05/15/2020   11:11 AM 04/15/2020   11:00 AM  GAD-7 Generalized Anxiety Disorder Screening Tool  1. Feeling Nervous, Anxious, or on Edge 1 0 2  2. Not Being Able to Stop or Control Worrying 0 0 1  3. Worrying Too Much About Different Things 1 0 1  4. Trouble Relaxing 0 0 0  5. Being So Restless it's Hard To Sit Still 0 0 0  6. Becoming Easily Annoyed or Irritable 1 0 2  7. Feeling Afraid As If Something Awful Might Happen 0 0 0  Total GAD-7 Score 3 0 6  Difficulty At Work, Home, or Getting  Along With Others? Not difficult at all Not difficult at all Somewhat difficult    PHQ-9 Scores    09/27/2021    8:09 AM 03/29/2021    9:11 AM 05/15/2020   11:10 AM  PHQ9 SCORE ONLY  PHQ-9 Total Score 0 3 2    --------------------------------------------------------------------------------------------------- Follow up for Morbid obesity:  The patient was last seen for this 6 months ago. Changes made at last visit include Referred to nutritionist. Continue phentermine,  considered GLP1 agonist but unlikely to get insurance coverage. Could also consider switching to buproprion-naltrexone in the future if weight loss adjunct still needed. Work on diet and exercise.  -----------------------------------------------------------------------------------------   Medications: Outpatient Medications Prior to Visit  Medication Sig   citalopram (CELEXA) 20 MG tablet TAKE 1 TABLET BY MOUTH EVERY DAY   pantoprazole (PROTONIX) 40 MG tablet TAKE 1 TABLET BY MOUTH EVERY DAY   triamterene-hydrochlorothiazide (MAXZIDE-25) 37.5-25 MG tablet TAKE 1 TABLET BY MOUTH EVERY DAY   phentermine 37.5 MG capsule Take 1 capsule (37.5 mg total) by mouth every morning. (Patient not taking: Reported on 09/27/2021)   No facility-administered medications prior to visit.    Review of Systems  Constitutional:  Negative for appetite change, chills and fever.  Respiratory:  Negative for chest tightness, shortness of breath and wheezing.   Cardiovascular:  Negative for chest pain and palpitations.  Gastrointestinal:  Negative for abdominal pain, nausea and vomiting.    Last thyroid functions Lab Results  Component Value Date   TSH 1.370 10/25/2019       Objective    BP 122/68 (BP Location: Left Arm, Patient Position: Sitting, Cuff Size: Large)   Pulse 77   Resp 16   Ht 6\' 2"  (1.88 m)   Wt 285 lb (129.3 kg)   SpO2 99%  BMI 36.59 kg/m  BP Readings from Last 3 Encounters:  09/27/21 122/68  03/29/21 122/80  09/21/20 116/78   Wt Readings from Last 3 Encounters:  09/27/21 285 lb (129.3 kg)  03/29/21 271 lb (122.9 kg)  09/21/20 263 lb (119.3 kg)      Physical Exam Vitals reviewed.  Constitutional:      General: He is not in acute distress.    Appearance: He is well-developed.  HENT:     Head: Normocephalic and atraumatic.     Right Ear: Hearing normal.     Left Ear: Hearing normal.     Nose: Nose normal.  Eyes:     General: Lids are normal. No scleral icterus.        Right eye: No discharge.        Left eye: No discharge.     Conjunctiva/sclera: Conjunctivae normal.  Cardiovascular:     Rate and Rhythm: Normal rate and regular rhythm.     Heart sounds: Normal heart sounds.  Pulmonary:     Effort: Pulmonary effort is normal. No respiratory distress.  Skin:    Findings: No lesion or rash.  Neurological:     General: No focal deficit present.     Mental Status: He is alert and oriented to person, place, and time.  Psychiatric:        Mood and Affect: Mood normal.        Speech: Speech normal.        Behavior: Behavior normal.        Thought Content: Thought content normal.        Judgment: Judgment normal.       No results found for any visits on 09/27/21.  Assessment & Plan     1. Morbid obesity ([place]) Try phentermine for 4 months and then see him back for physical in 6 months  2. Anxiety Improved.  GAD-7 this morning is 3.  Continue citalopram 20   No follow-ups on file.      I, Benjamin Durie, MD, have reviewed all documentation for this visit. The documentation on 09/27/21 for the exam, diagnosis, procedures, and orders are all accurate and complete.    Auriana Scalia Cranford Mon, MD  [facility] [phone] (phone) [phone] (fax)  [place]

## 2021-09-27 ENCOUNTER — Ambulatory Visit (INDEPENDENT_AMBULATORY_CARE_PROVIDER_SITE_OTHER): Payer: BC Managed Care – PPO | Admitting: Family Medicine

## 2021-09-27 ENCOUNTER — Encounter: Payer: Self-pay | Admitting: Family Medicine

## 2021-09-27 DIAGNOSIS — F419 Anxiety disorder, unspecified: Secondary | ICD-10-CM | POA: Diagnosis not present

## 2021-09-27 DIAGNOSIS — Z6837 Body mass index (BMI) 37.0-37.9, adult: Secondary | ICD-10-CM | POA: Diagnosis not present

## 2021-09-27 MED ORDER — PHENTERMINE HCL 37.5 MG PO CAPS: 37.5000 mg | ORAL_CAPSULE | Freq: Every morning | ORAL | 3 refills | Status: DC

## 2021-10-30 ENCOUNTER — Other Ambulatory Visit: Payer: Self-pay | Admitting: Family Medicine

## 2021-10-30 DIAGNOSIS — K219 Gastro-esophageal reflux disease without esophagitis: Secondary | ICD-10-CM

## 2021-11-09 ENCOUNTER — Other Ambulatory Visit: Payer: Self-pay | Admitting: Family Medicine

## 2021-11-09 DIAGNOSIS — I10 Essential (primary) hypertension: Secondary | ICD-10-CM

## 2021-11-14 ENCOUNTER — Other Ambulatory Visit: Payer: Self-pay | Admitting: Family Medicine

## 2021-11-14 DIAGNOSIS — F411 Generalized anxiety disorder: Secondary | ICD-10-CM

## 2021-11-15 NOTE — Telephone Encounter (Signed)
Requested Prescriptions  Pending Prescriptions Disp Refills  . citalopram (CELEXA) 20 MG tablet [Pharmacy Med Name: CITALOPRAM HBR 20 MG TABLET] 90 tablet 1    Sig: TAKE 1 TABLET BY MOUTH EVERY DAY     Psychiatry:  Antidepressants - SSRI Passed - 11/14/2021  1:16 AM      Passed - Valid encounter within last 6 months    Recent Outpatient Visits          1 month ago Morbid obesity [facility])   [facility] Maple Hudson., MD   7 months ago Anxiety   [facility] [place] D[place] M, DO   1 year ago Class 2 severe obesity due to excess calories with serious comorbidity and body mass index (BMI) of 37.0 to 37.9 in adult [facility])   [facility] Maple Hudson., MD   1 year ago Class 2 severe obesity due to excess calories with serious comorbidity and body mass index (BMI) of 35.0 to 35.9 in adult [facility])   [facility] [place], [place], [place]   1 year ago Syncope, unspecified syncope type   [facility] Flinchum, Eula Fried, FNP      Future Appointments            In 4 months Maple Hudson., MD [facility], [facility]

## 2022-02-12 ENCOUNTER — Other Ambulatory Visit: Payer: Self-pay | Admitting: Physician Assistant

## 2022-02-12 DIAGNOSIS — K219 Gastro-esophageal reflux disease without esophagitis: Secondary | ICD-10-CM

## 2022-03-30 ENCOUNTER — Encounter: Payer: PRIVATE HEALTH INSURANCE | Admitting: Family Medicine

## 2022-03-30 ENCOUNTER — Ambulatory Visit (INDEPENDENT_AMBULATORY_CARE_PROVIDER_SITE_OTHER): Payer: BC Managed Care – PPO | Admitting: Physician Assistant

## 2022-03-30 ENCOUNTER — Encounter: Payer: Self-pay | Admitting: Physician Assistant

## 2022-03-30 VITALS — BP 122/74 | HR 95 | Temp 98.7°F | Wt 284.0 lb

## 2022-03-30 DIAGNOSIS — J069 Acute upper respiratory infection, unspecified: Secondary | ICD-10-CM

## 2022-03-30 DIAGNOSIS — R509 Fever, unspecified: Secondary | ICD-10-CM | POA: Diagnosis not present

## 2022-03-30 LAB — POC COVID19 BINAXNOW: SARS Coronavirus 2 Ag: NEGATIVE

## 2022-03-30 LAB — POCT INFLUENZA A/B
Influenza A, POC: NEGATIVE
Influenza B, POC: NEGATIVE

## 2022-03-30 NOTE — Progress Notes (Signed)
     I,Connie R Striblin,acting as a Neurosurgeon for [employer], PA-C.,have documented all relevant documentation on the behalf of Alfredia Ferguson, PA-C,as directed by  Alfredia Ferguson, PA-C while in the presence of Alfredia Ferguson, PA-C.   Established patient visit   Patient: Benjamin Ritter   DOB: Oct 31, 1980   41 y.o. Male  MRN: [id number] Visit Date: 03/30/2022  Today's healthcare provider: Alfredia Ferguson, PA-C   Chief Complaint  Patient presents with   Annual Exam   Subjective    HPI  Cough: Patient complains of cough. Symptoms began 1 day ago. Cough described as non-productive, worsening over time. Patient denies dyspnea. Associated symptoms include chills, facial pain  , nasal congestion, sore throat, and sweats.. Reports fever with tmax of 102.5 F this AM. Current treatments have included acetaminophen, with poor improvement.    Review of Systems  Constitutional:  Positive for fatigue and fever.  HENT:  Positive for congestion, rhinorrhea and sinus pressure.   Respiratory:  Positive for cough. Negative for shortness of breath and wheezing.   Cardiovascular:  Negative for chest pain, palpitations and leg swelling.  Neurological:  Positive for headaches. Negative for dizziness.      Objective    BP 122/74 (BP Location: Left Arm, Patient Position: Sitting, Cuff Size: Large)   Pulse 95   Temp 98.7 F (37.1 C) (Oral)   Wt 284 lb (128.8 kg)   SpO2 99%   BMI 36.46 kg/m    Physical Exam Constitutional:      General: He is awake.     Appearance: He is well-developed.  HENT:     Head: Normocephalic.     Right Ear: Tympanic membrane normal.     Left Ear: Tympanic membrane normal.     Mouth/Throat:     Pharynx: Posterior oropharyngeal erythema present. No oropharyngeal exudate.  Eyes:     Conjunctiva/sclera: Conjunctivae normal.  Cardiovascular:     Rate and Rhythm: Normal rate and regular rhythm.     Heart sounds: Normal heart sounds.  Pulmonary:     Effort: Pulmonary  effort is normal.     Breath sounds: Normal breath sounds.  Skin:    General: Skin is warm.  Neurological:     Mental Status: He is alert and oriented to person, place, and time.  Psychiatric:        Attention and Perception: Attention normal.        Mood and Affect: Mood normal.        Speech: Speech normal.        Behavior: Behavior is cooperative.     Results for orders placed or performed in visit on 03/30/22  POC COVID-19  Result Value Ref Range   SARS Coronavirus 2 Ag Negative Negative  POCT Influenza A/B  Result Value Ref Range   Influenza A, POC Negative Negative   Influenza B, POC Negative Negative    Assessment & Plan     URI Poc flu neg poc covid neg Advised rest, fluids, tylenol, mucinex If symptoms continue past day 7-10 please call office  Return if symptoms worsen or fail to improve.      I, Alfredia Ferguson, PA-C have reviewed all documentation for this visit. The documentation on  03/30/2022  for the exam, diagnosis, procedures, and orders are all accurate and complete.  Alfredia Ferguson, PA-C [facility] [address] [place], [place], [address] Office: [phone] Fax: [phone]   [facility]

## 2022-05-03 ENCOUNTER — Ambulatory Visit (INDEPENDENT_AMBULATORY_CARE_PROVIDER_SITE_OTHER): Payer: BC Managed Care – PPO | Admitting: Physician Assistant

## 2022-05-03 ENCOUNTER — Encounter: Payer: Self-pay | Admitting: Physician Assistant

## 2022-05-03 VITALS — BP 121/77 | HR 73 | Temp 97.6°F | Ht 74.0 in | Wt 256.0 lb

## 2022-05-03 DIAGNOSIS — E781 Pure hyperglyceridemia: Secondary | ICD-10-CM | POA: Diagnosis not present

## 2022-05-03 DIAGNOSIS — F419 Anxiety disorder, unspecified: Secondary | ICD-10-CM | POA: Diagnosis not present

## 2022-05-03 DIAGNOSIS — K219 Gastro-esophageal reflux disease without esophagitis: Secondary | ICD-10-CM

## 2022-05-03 DIAGNOSIS — I10 Essential (primary) hypertension: Secondary | ICD-10-CM | POA: Insufficient documentation

## 2022-05-03 DIAGNOSIS — Z0000 Encounter for general adult medical examination without abnormal findings: Secondary | ICD-10-CM

## 2022-05-03 DIAGNOSIS — R739 Hyperglycemia, unspecified: Secondary | ICD-10-CM | POA: Diagnosis not present

## 2022-05-03 DIAGNOSIS — E6609 Other obesity due to excess calories: Secondary | ICD-10-CM

## 2022-05-03 DIAGNOSIS — Z6832 Body mass index (BMI) 32.0-32.9, adult: Secondary | ICD-10-CM

## 2022-05-03 DIAGNOSIS — R42 Dizziness and giddiness: Secondary | ICD-10-CM

## 2022-05-03 MED ORDER — CITALOPRAM HYDROBROMIDE 10 MG PO TABS: 10.0000 mg | ORAL_TABLET | Freq: Every day | ORAL | 1 refills | Status: DC

## 2022-05-03 MED ORDER — PHENTERMINE HCL 37.5 MG PO CAPS: 37.5000 mg | ORAL_CAPSULE | Freq: Every morning | ORAL | 3 refills | Status: DC

## 2022-05-03 NOTE — Assessment & Plan Note (Signed)
Managed with daily PPI  

## 2022-05-03 NOTE — Assessment & Plan Note (Signed)
Pt was previously managed on phentermine without side effects  He had first started 12/22 weight was 271 lbs, today 256 lbs. Okay with restarting given normal vitals and no SE while taking

## 2022-05-03 NOTE — Assessment & Plan Note (Signed)
Will repeat fasting lipids/cmp today The 10-year ASCVD risk score (Arnett DK, et al., 2019) is: 2.9%

## 2022-05-03 NOTE — Assessment & Plan Note (Signed)
Pt reports some days with increased levels of anxiety. Discussed increasing celexa to 30 mg - -take 20 mg and 10 mg tablet.

## 2022-05-03 NOTE — Progress Notes (Signed)
Complete physical exam   Patient: Benjamin Ritter   DOB: [birth year]   42 y.o. Male  MRN: [id number] Visit Date: 05/03/2022  Today's healthcare provider: Alfredia Ferguson, PA-C   Cc. cpe  Subjective    Benjamin Ritter is a 42 y.o. male who presents today for a complete physical exam.  He reports consuming a general diet.  He generally feels well. He reports sleeping well. He does have additional problems to discuss today.  HPI   Pt has questions today about re-starting phentermine. He has taken it since 12/22, stopped for a few weeks and then restarted, he ran out and has gained ~7-8 pounds since. Denies chest pain, lightheadedness, heart palpitations.   Pt reports some days with higher anxiety levels, that he wants to increase his celexa dose.      05/03/2022    8:38 AM 09/27/2021    8:14 AM 05/15/2020   11:11 AM 04/15/2020   11:00 AM  GAD 7 : Generalized Anxiety Score  Nervous, Anxious, on Edge 1 1 0 2  Control/stop worrying 0 0 0 1  Worry too much - different things 0 1 0 1  Trouble relaxing 0 0 0 0  Restless 0 0 0 0  Easily annoyed or irritable 0 1 0 2  Afraid - awful might happen 0 0 0 0  Total GAD 7 Score 1 3 0 6  Anxiety Difficulty Not difficult at all Not difficult at all Not difficult at all Somewhat difficult      Past Medical History:  Diagnosis Date   H/O heartburn    Transient elevated blood pressure    Past Surgical History:  Procedure Laterality Date   NO PAST SURGERIES     Social History   Socioeconomic History   Marital status: Married    Spouse name: Not on file   Number of children: Not on file   Years of education: Not on file   Highest education level: Not on file  Occupational History   Not on file  Tobacco Use   Smoking status: Never   Smokeless tobacco: Never  Substance and Sexual Activity   Alcohol use: Yes   Drug use: No   Sexual activity: Yes  Other Topics Concern   Not on file  Social History Narrative   Not on file    Social Determinants of Health   Financial Resource Strain: Not on file  Food Insecurity: Not on file  Transportation Needs: Not on file  Physical Activity: Not on file  Stress: Not on file  Social Connections: Not on file  Intimate Partner Violence: Not on file   Family Status  Relation Name Status   Father  Alive   Sister Lafonda Mosses Alive   Brother  Alive   Daughter  Alive   MGM  Deceased   PGM  Deceased   Brother  Alive   Brother  Alive   Brother  Alive   Brother  Alive   Brother  Alive   Brother  Alive   Brother  Alive   Sister [place], age 35y       myastheni gradis Dx at 51   Sister  Alive   Mother  Deceased       Melanoma   PGF  Deceased   MGF  Deceased   Family History  Problem Relation Age of Onset   Diabetes Father    Cancer Sister 48       Breast cancer  Healthy Brother    ADD / ADHD Daughter    Cancer Maternal Grandmother        lung   Cancer Paternal Grandmother    Healthy Brother    Healthy Brother    Healthy Brother    Healthy Brother    Healthy Brother    Healthy Brother    Alcohol abuse Brother    Healthy Sister    Healthy Sister    No Known Allergies  Patient Care Team: Mikey Kirschner, PA-C as PCP - General (Physician Assistant)   Medications: Outpatient Medications Prior to Visit  Medication Sig   citalopram (CELEXA) 20 MG tablet TAKE 1 TABLET BY MOUTH EVERY DAY   pantoprazole (PROTONIX) 40 MG tablet TAKE 1 TABLET BY MOUTH EVERY DAY   triamterene-hydrochlorothiazide (MAXZIDE-25) 37.5-25 MG tablet TAKE 1 TABLET BY MOUTH EVERY DAY   [DISCONTINUED] phentermine 37.5 MG capsule Take 1 capsule (37.5 mg total) by mouth every morning. (Patient not taking: Reported on 03/30/2022)   No facility-administered medications prior to visit.    Review of Systems  Constitutional:  Negative for fatigue and fever.  Respiratory:  Negative for cough and shortness of breath.   Cardiovascular:  Negative for chest pain, palpitations and leg  swelling.  Neurological:  Negative for dizziness and headaches.     Objective    BP 121/77 (BP Location: Left Arm, Patient Position: Sitting, Cuff Size: Large)   Pulse 73   Temp 97.6 F (36.4 C)   Ht 6\' 2"  (1.88 m)   Wt 256 lb (116.1 kg)   SpO2 99%   BMI 32.87 kg/m    Physical Exam Constitutional:      General: He is awake.     Appearance: He is well-developed.  HENT:     Head: Normocephalic.     Right Ear: Tympanic membrane, ear canal and external ear normal.     Left Ear: Tympanic membrane, ear canal and external ear normal.     Nose: Nose normal. No congestion or rhinorrhea.     Mouth/Throat:     Mouth: Mucous membranes are moist.     Pharynx: No oropharyngeal exudate or posterior oropharyngeal erythema.  Eyes:     Pupils: Pupils are equal, round, and reactive to light.  Cardiovascular:     Rate and Rhythm: Normal rate and regular rhythm.     Heart sounds: Normal heart sounds.  Pulmonary:     Effort: Pulmonary effort is normal.     Breath sounds: Normal breath sounds.  Abdominal:     General: There is no distension.     Palpations: Abdomen is soft.     Tenderness: There is no abdominal tenderness. There is no guarding.  Musculoskeletal:     Cervical back: Normal range of motion.     Right lower leg: No edema.     Left lower leg: No edema.  Lymphadenopathy:     Cervical: No cervical adenopathy.  Skin:    General: Skin is warm.  Neurological:     Mental Status: He is alert and oriented to person, place, and time.  Psychiatric:        Attention and Perception: Attention normal.        Mood and Affect: Mood normal.        Speech: Speech normal.        Behavior: Behavior normal. Behavior is cooperative.     Last depression screening scores    05/03/2022    8:15 AM 03/30/2022  2:30 PM 09/27/2021    8:09 AM  PHQ 2/9 Scores  PHQ - 2 Score 0 0 0  PHQ- 9 Score 2 1 0   Last fall risk screening    03/30/2022    2:30 PM  [place] in the past  year? 0  Number falls in past yr: 0  Injury with Fall? 0   Last Audit-C alcohol use screening    03/30/2022    2:31 PM  Alcohol Use Disorder Test (AUDIT)  1. How often do you have a drink containing alcohol? 1  2. How many drinks containing alcohol do you have on a typical day when you are drinking? 0  3. How often do you have six or more drinks on one occasion? 1  AUDIT-C Score 2   A score of 3 or more in women, and 4 or more in men indicates increased risk for alcohol abuse, EXCEPT if all of the points are from question 1   No results found for any visits on 05/03/22.  Assessment & Plan    Routine Health Maintenance and Physical Exam  Exercise Activities and Dietary recommendations --balanced diet high in fiber and protein, low in sugars, carbs, fats. --physical activity/exercise 30 minutes 3-5 times a week     Immunization History  Administered Date(s) Administered   Influenza Inj Mdck Quad Pf 02/03/2017   Influenza Inj Mdck Quad With Preservative 01/31/2018   Influenza-Unspecified 01/17/2015, 03/20/2018, 02/19/2019   PFIZER Comirnaty(Gray Top)Covid-19 Tri-Sucrose Vaccine 06/05/2020   PFIZER(Purple Top)SARS-COV-2 Vaccination 06/30/2019, 07/22/2019   Tdap 05/23/2016    Health Maintenance  Topic Date Due   COVID-19 Vaccine (4 - 2023-24 season) 05/19/2022 (Originally 12/17/2021)   INFLUENZA VACCINE  07/17/2022 (Originally 11/16/2021)   DTaP/Tdap/Td (2 - Td or Tdap) 05/23/2026   Hepatitis C Screening  Completed   HIV Screening  Completed   HPV VACCINES  Aged Out    Discussed health benefits of physical activity, and encouraged him to engage in regular exercise appropriate for his age and condition.  Problem List Items Addressed This Visit       Digestive   GERD (gastroesophageal reflux disease)    Managed with daily PPI        Other   Episodic lightheadedness    Has been considered as meniere's disease, pt takes maxide 25 daily to manage symptoms      Anxiety     Pt reports some days with increased levels of anxiety. Discussed increasing celexa to 30 mg - -take 20 mg and 10 mg tablet.       Relevant Medications   citalopram (CELEXA) 10 MG tablet   Class 1 obesity due to excess calories without serious comorbidity with body mass index (BMI) of 32.0 to 32.9 in adult    Pt was previously managed on phentermine without side effects  He had first started 12/22 weight was 271 lbs, today 256 lbs. Okay with restarting given normal vitals and no SE while taking       Relevant Medications   phentermine 37.5 MG capsule   Hypertriglyceridemia    Will repeat fasting lipids/cmp today The 10-year ASCVD risk score (Arnett DK, et al., 2019) is: 2.9%       Relevant Orders   CBC w/Diff/Platelet   Comprehensive Metabolic Panel (CMET)   Lipid Profile   Other Visit Diagnoses     Annual physical exam    -  Primary   Relevant Orders   CBC w/Diff/Platelet  Comprehensive Metabolic Panel (CMET)   Hyperglycemia       Relevant Orders   HgB A1c       Pt declined COVID and flu vaccines today.  Return in about 6 months (around 11/01/2022) for anxiety, weight Management.     I, Alfredia Ferguson, PA-C have reviewed all documentation for this visit. The documentation on  05/03/22 for the exam, diagnosis, procedures, and orders are all accurate and complete.  Alfredia Ferguson, PA-C [facility] [address] [place], [place], [address] Office: [phone] Fax: [phone]   [facility]

## 2022-05-03 NOTE — Assessment & Plan Note (Signed)
Has been considered as meniere's disease, pt takes maxide 25 daily to manage symptoms

## 2022-05-04 LAB — COMPREHENSIVE METABOLIC PANEL
ALT: 19 IU/L (ref 0–44)
AST: 15 IU/L (ref 0–40)
Albumin/Globulin Ratio: 1.7 (ref 1.2–2.2)
Albumin: 4.5 g/dL (ref 4.1–5.1)
Alkaline Phosphatase: 58 IU/L (ref 44–121)
BUN/Creatinine Ratio: 13 (ref 9–20)
BUN: 11 mg/dL (ref 6–24)
Bilirubin Total: 0.3 mg/dL (ref 0.0–1.2)
CO2: 24 mmol/L (ref 20–29)
Calcium: 8.9 mg/dL (ref 8.7–10.2)
Chloride: 103 mmol/L (ref 96–106)
Creatinine, Ser: 0.85 mg/dL (ref 0.76–1.27)
Globulin, Total: 2.6 g/dL (ref 1.5–4.5)
Glucose: 98 mg/dL (ref 70–99)
Potassium: 4.1 mmol/L (ref 3.5–5.2)
Sodium: 141 mmol/L (ref 134–144)
Total Protein: 7.1 g/dL (ref 6.0–8.5)
eGFR: 112 mL/min/{1.73_m2} (ref >59)

## 2022-05-04 LAB — CBC WITH DIFFERENTIAL/PLATELET
Basophils Absolute: 0.1 10*3/uL (ref 0.0–0.2)
Basos: 1 %
EOS (ABSOLUTE): 0.2 10*3/uL (ref 0.0–0.4)
Eos: 3 %
Hematocrit: 45.5 % (ref 37.5–51.0)
Hemoglobin: 14.6 g/dL (ref 13.0–17.7)
Immature Grans (Abs): 0 10*3/uL (ref 0.0–0.1)
Immature Granulocytes: 0 %
Lymphocytes Absolute: 2.9 10*3/uL (ref 0.7–3.1)
Lymphs: 47 %
MCH: 27.4 pg (ref 26.6–33.0)
MCHC: 32.1 g/dL (ref 31.5–35.7)
MCV: 86 fL (ref 79–97)
Monocytes Absolute: 0.5 10*3/uL (ref 0.1–0.9)
Monocytes: 8 %
Neutrophils Absolute: 2.6 10*3/uL (ref 1.4–7.0)
Neutrophils: 41 %
Platelets: 224 10*3/uL (ref 150–450)
RBC: 5.32 x10E6/uL (ref 4.14–5.80)
RDW: 12.9 % (ref 11.6–15.4)
WBC: 6.2 10*3/uL (ref 3.4–10.8)

## 2022-05-04 LAB — LIPID PANEL
Chol/HDL Ratio: 5.1 ratio — ABNORMAL HIGH (ref 0.0–5.0)
Cholesterol, Total: 180 mg/dL (ref 100–199)
HDL: 35 mg/dL — ABNORMAL LOW (ref >39)
LDL Chol Calc (NIH): 110 mg/dL — ABNORMAL HIGH (ref 0–99)
Triglycerides: 199 mg/dL — ABNORMAL HIGH (ref 0–149)
VLDL Cholesterol Cal: 35 mg/dL (ref 5–40)

## 2022-05-04 LAB — HEMOGLOBIN A1C
Est. average glucose Bld gHb Est-mCnc: 117 mg/dL
Hgb A1c MFr Bld: 5.7 % — ABNORMAL HIGH (ref 4.8–5.6)

## 2022-05-11 ENCOUNTER — Other Ambulatory Visit: Payer: Self-pay | Admitting: Physician Assistant

## 2022-05-11 DIAGNOSIS — F411 Generalized anxiety disorder: Secondary | ICD-10-CM

## 2022-07-27 ENCOUNTER — Other Ambulatory Visit: Payer: Self-pay | Admitting: Physician Assistant

## 2022-07-27 DIAGNOSIS — F411 Generalized anxiety disorder: Secondary | ICD-10-CM

## 2022-08-01 ENCOUNTER — Encounter: Payer: Self-pay | Admitting: Physician Assistant

## 2022-08-01 DIAGNOSIS — F411 Generalized anxiety disorder: Secondary | ICD-10-CM

## 2022-08-02 MED ORDER — CITALOPRAM HYDROBROMIDE 20 MG PO TABS: 20.0000 mg | ORAL_TABLET | Freq: Every day | ORAL | 1 refills | Status: DC

## 2022-08-07 ENCOUNTER — Other Ambulatory Visit: Payer: Self-pay | Admitting: Physician Assistant

## 2022-08-07 DIAGNOSIS — K219 Gastro-esophageal reflux disease without esophagitis: Secondary | ICD-10-CM

## 2022-08-12 ENCOUNTER — Encounter: Payer: Self-pay | Admitting: Physician Assistant

## 2022-08-16 ENCOUNTER — Telehealth: Payer: Self-pay | Admitting: Physician Assistant

## 2022-08-16 NOTE — Telephone Encounter (Signed)
LVM .Patient medical evaluation form was completed & faxed. Copy will be left up front for pick up.

## 2022-09-26 NOTE — Progress Notes (Unsigned)
     I,Mordche Hedglin,acting as a Neurosurgeon for [employer], PA-C.,have documented all relevant documentation on the behalf of Alfredia Ferguson, PA-C,as directed by  Alfredia Ferguson, PA-C while in the presence of Alfredia Ferguson, PA-C.   Established patient visit   Patient: Benjamin Ritter   DOB: 12/12/1980   42 y.o. Male  MRN: [id number] Visit Date: 09/27/2022  Today's healthcare provider: Alfredia Ferguson, PA-C   No chief complaint on file.  Subjective    HPI  Patient is here today for a tick bite,  Medications: Outpatient Medications Prior to Visit  Medication Sig   citalopram (CELEXA) 10 MG tablet Take 1 tablet (10 mg total) by mouth daily. Take with 20 mg tablet   citalopram (CELEXA) 20 MG tablet Take 1 tablet (20 mg total) by mouth daily.   pantoprazole (PROTONIX) 40 MG tablet TAKE 1 TABLET BY MOUTH EVERY DAY   phentermine 37.5 MG capsule Take 1 capsule (37.5 mg total) by mouth every morning.   triamterene-hydrochlorothiazide (MAXZIDE-25) 37.5-25 MG tablet TAKE 1 TABLET BY MOUTH EVERY DAY   No facility-administered medications prior to visit.    Review of Systems  {Labs  Heme  Chem  Endocrine  Serology  Results Review (optional):23779}   Objective    There were no vitals taken for this visit. {Show previous Casha Estupinan signs (optional):23777}  Physical Exam  ***  No results found for any visits on 09/27/22.  Assessment & Plan     ***  No follow-ups on file.      {provider attestation***:1}   Alfredia Ferguson, PA-C  [facility] [phone] (phone) [phone] (fax)  [facility]

## 2022-09-27 ENCOUNTER — Ambulatory Visit: Payer: BC Managed Care – PPO | Admitting: Physician Assistant

## 2022-09-27 ENCOUNTER — Encounter: Payer: Self-pay | Admitting: Physician Assistant

## 2022-09-27 VITALS — BP 128/85 | HR 85 | Temp 98.0°F | Resp 15 | Ht 74.5 in | Wt 270.4 lb

## 2022-09-27 DIAGNOSIS — W57XXXA Bitten or stung by nonvenomous insect and other nonvenomous arthropods, initial encounter: Secondary | ICD-10-CM

## 2022-09-27 DIAGNOSIS — S90561A Insect bite (nonvenomous), right ankle, initial encounter: Secondary | ICD-10-CM | POA: Diagnosis not present

## 2022-09-27 MED ORDER — DOXYCYCLINE HYCLATE 100 MG PO TABS: 100.0000 mg | ORAL_TABLET | Freq: Two times a day (BID) | ORAL | 0 refills | Status: DC

## 2022-10-04 LAB — SPOTTED FEVER GROUP ANTIBODIES
Spotted Fever Group IgG: <1:64 {titer}
Spotted Fever Group IgM: <1:64 {titer}

## 2022-10-04 LAB — LYME DISEASE SEROLOGY W/REFLEX: Lyme Total Antibody EIA: NEGATIVE

## 2022-10-22 ENCOUNTER — Other Ambulatory Visit: Payer: Self-pay | Admitting: Physician Assistant

## 2022-10-22 DIAGNOSIS — F419 Anxiety disorder, unspecified: Secondary | ICD-10-CM

## 2022-10-24 NOTE — Telephone Encounter (Signed)
Requested Prescriptions  Pending Prescriptions Disp Refills   citalopram (CELEXA) 10 MG tablet [Pharmacy Med Name: CITALOPRAM HBR 10 MG TABLET] 90 tablet 0    Sig: TAKE 1 TABLET (10 MG TOTAL) BY MOUTH DAILY. TAKE WITH 20 MG TABLET     Psychiatry:  Antidepressants - SSRI Passed - 10/22/2022  1:13 AM      Passed - Valid encounter within last 6 months    Recent Outpatient Visits           3 weeks ago Tick bite of right ankle, initial encounter   [facility] Alfredia Ferguson, PA-C   5 months ago Annual physical exam   [facility] Alfredia Ferguson, PA-C   6 months ago Upper respiratory tract infection, unspecified type   [facility] Alfredia Ferguson, PA-C   1 year ago Morbid obesity [facility])   [facility] Bosie Clos, MD   1 year ago Anxiety   [facility] Caro [place], [place]

## 2023-01-03 NOTE — Progress Notes (Unsigned)
Type of Exam: Above Waist Total # of nevi on patient's arms (elbow to shoulder): 1-5  Exam comments: Discussed skin care with sunscreen due to outside work.  Presumptive diagnosis: no significant findings Biopsy recommended? No Referred? No

## 2023-01-04 ENCOUNTER — Other Ambulatory Visit: Payer: Self-pay | Admitting: Hematology and Oncology

## 2023-01-04 ENCOUNTER — Other Ambulatory Visit: Payer: Self-pay | Admitting: *Deleted

## 2023-01-04 ENCOUNTER — Other Ambulatory Visit: Payer: Self-pay | Admitting: Urology

## 2023-01-04 DIAGNOSIS — Z125 Encounter for screening for malignant neoplasm of prostate: Secondary | ICD-10-CM

## 2023-01-04 DIAGNOSIS — Z1283 Encounter for screening for malignant neoplasm of skin: Secondary | ICD-10-CM

## 2023-01-04 NOTE — Progress Notes (Signed)
Patient: Benjamin Ritter           Date of Birth: December 23, 1980           MRN: [id number] Visit Date: 01/04/2023 PCP: Alfredia Ferguson, PA-C  Prostate Cancer Screening Date of last physical exam:  (A year ago) Date of last rectal exam:  (NA) Have you ever had any of the following?: None Have you ever had or been told you have an allergy to latex products?:  (Unknown) Are you currently taking any natural prostate preparations?:  (Unknown) Are you currently experiencing any urinary symptoms?:  (Unknown)  Prostate Exam Exam not completed. PSA Blood test completed only.  Patient's History Patient Active Problem List   Diagnosis Date Noted   GERD (gastroesophageal reflux disease) 05/03/2022   Class 1 obesity due to excess calories without serious comorbidity with body mass index (BMI) of 32.0 to 32.9 in adult 05/03/2022   Hypertriglyceridemia 05/03/2022   Anxiety 03/29/2021   Syncope 06/09/2020   Episodic lightheadedness 06/09/2020   Past Medical History:  Diagnosis Date   H/O heartburn    Transient elevated blood pressure     Family History  Problem Relation Age of Onset   Diabetes Father    Cancer Sister 77       Breast cancer   Healthy Brother    ADD / ADHD Daughter    Cancer Maternal Grandmother        lung   Cancer Paternal Grandmother    Healthy Brother    Healthy Brother    Healthy Brother    Healthy Brother    Healthy Brother    Healthy Brother    Alcohol abuse Brother    Healthy Sister    Healthy Sister     Social History   Occupational History   Not on file  Tobacco Use   Smoking status: Never   Smokeless tobacco: Never  Substance and Sexual Activity   Alcohol use: Yes   Drug use: No   Sexual activity: Yes

## 2023-01-05 LAB — PSA: Prostate Specific Ag, Serum: 0.4 ng/mL (ref 0.0–4.0)

## 2023-01-07 ENCOUNTER — Encounter: Payer: Self-pay | Admitting: Physician Assistant

## 2023-01-07 DIAGNOSIS — F411 Generalized anxiety disorder: Secondary | ICD-10-CM

## 2023-01-07 DIAGNOSIS — K219 Gastro-esophageal reflux disease without esophagitis: Secondary | ICD-10-CM

## 2023-01-07 DIAGNOSIS — F419 Anxiety disorder, unspecified: Secondary | ICD-10-CM

## 2023-01-07 DIAGNOSIS — I10 Essential (primary) hypertension: Secondary | ICD-10-CM

## 2023-01-09 ENCOUNTER — Other Ambulatory Visit: Payer: Self-pay | Admitting: Physician Assistant

## 2023-01-09 DIAGNOSIS — F411 Generalized anxiety disorder: Secondary | ICD-10-CM

## 2023-01-09 DIAGNOSIS — F419 Anxiety disorder, unspecified: Secondary | ICD-10-CM

## 2023-01-09 MED ORDER — TRIAMTERENE-HCTZ 37.5-25 MG PO TABS
1.0000 | ORAL_TABLET | Freq: Every day | ORAL | 0 refills | Status: DC
Start: 2023-01-09 — End: 2023-02-02

## 2023-01-09 MED ORDER — CITALOPRAM HYDROBROMIDE 20 MG PO TABS
20.0000 mg | ORAL_TABLET | Freq: Every day | ORAL | 0 refills | Status: DC
Start: 2023-01-09 — End: 2023-04-14

## 2023-01-09 MED ORDER — PANTOPRAZOLE SODIUM 40 MG PO TBEC
40.0000 mg | DELAYED_RELEASE_TABLET | Freq: Every day | ORAL | 0 refills | Status: DC
Start: 2023-01-09 — End: 2023-01-24

## 2023-01-09 MED ORDER — CITALOPRAM HYDROBROMIDE 10 MG PO TABS
10.0000 mg | ORAL_TABLET | Freq: Every day | ORAL | 0 refills | Status: DC
Start: 2023-01-09 — End: 2023-04-14

## 2023-01-23 ENCOUNTER — Telehealth: Payer: Self-pay | Admitting: Family Medicine

## 2023-01-23 DIAGNOSIS — K219 Gastro-esophageal reflux disease without esophagitis: Secondary | ICD-10-CM

## 2023-01-23 NOTE — Telephone Encounter (Signed)
[facility]ill pantoprazole (PROTONIX) 40 MG tablet  Please advise

## 2023-01-24 MED ORDER — PANTOPRAZOLE SODIUM 40 MG PO TBEC
40.0000 mg | DELAYED_RELEASE_TABLET | Freq: Every day | ORAL | 1 refills | Status: DC
Start: 2023-01-24 — End: 2023-03-20

## 2023-02-01 ENCOUNTER — Other Ambulatory Visit: Payer: Self-pay | Admitting: Physician Assistant

## 2023-02-01 DIAGNOSIS — I10 Essential (primary) hypertension: Secondary | ICD-10-CM

## 2023-03-07 ENCOUNTER — Other Ambulatory Visit: Payer: Self-pay | Admitting: Physician Assistant

## 2023-03-07 DIAGNOSIS — F419 Anxiety disorder, unspecified: Secondary | ICD-10-CM

## 2023-03-07 DIAGNOSIS — F411 Generalized anxiety disorder: Secondary | ICD-10-CM

## 2023-03-18 ENCOUNTER — Other Ambulatory Visit: Payer: Self-pay | Admitting: Family Medicine

## 2023-03-18 DIAGNOSIS — K219 Gastro-esophageal reflux disease without esophagitis: Secondary | ICD-10-CM

## 2023-04-03 ENCOUNTER — Encounter: Payer: BC Managed Care – PPO | Admitting: Physician Assistant

## 2023-04-14 ENCOUNTER — Other Ambulatory Visit: Payer: Self-pay

## 2023-04-14 ENCOUNTER — Other Ambulatory Visit: Payer: Self-pay | Admitting: Physician Assistant

## 2023-04-14 ENCOUNTER — Encounter: Payer: Self-pay | Admitting: Family Medicine

## 2023-04-14 DIAGNOSIS — F419 Anxiety disorder, unspecified: Secondary | ICD-10-CM

## 2023-04-14 DIAGNOSIS — F411 Generalized anxiety disorder: Secondary | ICD-10-CM

## 2023-04-14 NOTE — Telephone Encounter (Signed)
Patient has appointment on 05/05/23

## 2023-04-17 ENCOUNTER — Other Ambulatory Visit: Payer: Self-pay | Admitting: Family Medicine

## 2023-04-17 DIAGNOSIS — F419 Anxiety disorder, unspecified: Secondary | ICD-10-CM

## 2023-04-17 MED ORDER — CITALOPRAM HYDROBROMIDE 10 MG PO TABS: 10.0000 mg | ORAL_TABLET | Freq: Every day | ORAL | 0 refills | Status: DC

## 2023-04-17 MED ORDER — CITALOPRAM HYDROBROMIDE 20 MG PO TABS: 20.0000 mg | ORAL_TABLET | Freq: Every day | ORAL | 0 refills | Status: DC

## 2023-04-17 NOTE — Telephone Encounter (Addendum)
Medication Refill -  Most Recent Primary Care Visit:  Provider: Alfredia Ferguson  Department: [facility]  Visit Type: OFFICE VISIT  Date: 09/27/2022  Pt has sent messages several times , started in September, for this refill and says spoke with pharmacy, but nothing seems to get done  Medication: citalopram (CELEXA) 10 MG tablet   Has the patient contacted their pharmacy? yes (Agent: If yes, when and what did the pharmacy advise?)contact pcp  Is this the correct pharmacy for this prescription? yes  This is the patient's preferred pharmacy:  [facility] [address] [place] [facility] [address] [place] [address] Phone: [phone] Fax: [phone]   Has the prescription been filled recently? no  Is the patient out of the medication? yes  Has the patient been seen for an appointment in the last year OR does the patient have an upcoming appointment? yes  Can we respond through MyChart? yes  Agent: Please be advised that Rx refills may take up to 3 business days. We ask that you follow-up with your pharmacy.

## 2023-04-21 NOTE — Telephone Encounter (Signed)
 Requested Prescriptions  Refused Prescriptions Disp Refills   citalopram  (CELEXA ) 10 MG tablet 30 tablet 0    Sig: Take 1 tablet (10 mg total) by mouth daily. Take with 20 mg tablet     Psychiatry:  Antidepressants - SSRI Failed - 04/21/2023  1:37 PM      Failed - Valid encounter within last 6 months    Recent Outpatient Visits           6 months ago Tick bite of right ankle, initial encounter   [facility] Cyndi Shaver, PA-C   11 months ago Annual physical exam   [facility] Cyndi Shaver, PA-C   1 year ago Upper respiratory tract infection, unspecified type   [facility] Cyndi Shaver, PA-C   1 year ago Morbid obesity [facility])   [facility] Bertrum Charlie LITTIE Mickey., MD   2 years ago Anxiety   [facility] Madelon Donald HERO, DO       Future Appointments             In 2 weeks Pardue, Lauraine SAILOR, DO [facility]

## 2023-04-29 ENCOUNTER — Other Ambulatory Visit: Payer: Self-pay | Admitting: Family Medicine

## 2023-04-29 DIAGNOSIS — I10 Essential (primary) hypertension: Secondary | ICD-10-CM

## 2023-05-01 ENCOUNTER — Other Ambulatory Visit: Payer: Self-pay | Admitting: Family Medicine

## 2023-05-01 DIAGNOSIS — I10 Essential (primary) hypertension: Secondary | ICD-10-CM

## 2023-05-01 MED ORDER — TRIAMTERENE-HCTZ 37.5-25 MG PO TABS: 1.0000 | ORAL_TABLET | Freq: Every day | ORAL | 0 refills | Status: DC

## 2023-05-05 ENCOUNTER — Encounter: Payer: Self-pay | Admitting: Family Medicine

## 2023-05-05 ENCOUNTER — Ambulatory Visit: Payer: BC Managed Care – PPO | Admitting: Family Medicine

## 2023-05-05 VITALS — BP 135/76 | HR 82 | Resp 18 | Ht 74.5 in | Wt 299.6 lb

## 2023-05-05 DIAGNOSIS — I10 Essential (primary) hypertension: Secondary | ICD-10-CM

## 2023-05-05 DIAGNOSIS — Z0001 Encounter for general adult medical examination with abnormal findings: Secondary | ICD-10-CM | POA: Diagnosis not present

## 2023-05-05 DIAGNOSIS — R7309 Other abnormal glucose: Secondary | ICD-10-CM | POA: Diagnosis not present

## 2023-05-05 DIAGNOSIS — K219 Gastro-esophageal reflux disease without esophagitis: Secondary | ICD-10-CM | POA: Diagnosis not present

## 2023-05-05 DIAGNOSIS — F411 Generalized anxiety disorder: Secondary | ICD-10-CM

## 2023-05-05 DIAGNOSIS — Z0000 Encounter for general adult medical examination without abnormal findings: Secondary | ICD-10-CM | POA: Diagnosis not present

## 2023-05-05 DIAGNOSIS — E781 Pure hyperglyceridemia: Secondary | ICD-10-CM

## 2023-05-05 DIAGNOSIS — R5382 Chronic fatigue, unspecified: Secondary | ICD-10-CM | POA: Insufficient documentation

## 2023-05-05 DIAGNOSIS — E785 Hyperlipidemia, unspecified: Secondary | ICD-10-CM | POA: Insufficient documentation

## 2023-05-05 MED ORDER — TRIAMTERENE-HCTZ 37.5-25 MG PO TABS: 1.0000 | ORAL_TABLET | Freq: Every day | ORAL | 3 refills | Status: DC

## 2023-05-05 MED ORDER — PHENTERMINE HCL 37.5 MG PO CAPS: 37.5000 mg | ORAL_CAPSULE | Freq: Every morning | ORAL | 2 refills | Status: DC

## 2023-05-05 MED ORDER — CITALOPRAM HYDROBROMIDE 10 MG PO TABS: 10.0000 mg | ORAL_TABLET | Freq: Every day | ORAL | 3 refills | Status: DC

## 2023-05-05 MED ORDER — CITALOPRAM HYDROBROMIDE 20 MG PO TABS: 20.0000 mg | ORAL_TABLET | Freq: Every day | ORAL | 3 refills | Status: DC

## 2023-05-05 MED ORDER — PANTOPRAZOLE SODIUM 40 MG PO TBEC: 40.0000 mg | DELAYED_RELEASE_TABLET | Freq: Every day | ORAL | 3 refills | Status: AC

## 2023-05-05 NOTE — Assessment & Plan Note (Signed)
Recheck today. 

## 2023-05-05 NOTE — Assessment & Plan Note (Signed)
Reports increased fatigue since Christmas, despite adequate sleep. No associated symptoms like dizziness, lightheadedness, or headaches. Discussed potential causes including vitamin deficiencies and elevated A1c. - Order vitamin B12 and vitamin D levels.

## 2023-05-05 NOTE — Assessment & Plan Note (Signed)
Stable on citalopram. Continue citalopram 10 mg with citalopram 20 mg (total 30 mg) daily.

## 2023-05-05 NOTE — Patient Instructions (Addendum)
Check insurance formulary for additional options for weight loss.   Increase your intake of fresh/frozen fruits and vegetables.  The Mediterranean diet is a great reference for meal ideas.  I strongly encourage you to incorporate exercise into your daily routine (at least 30-60 minutes most days of the week) and monitor your dietary intake. - I encourage you to measure/weigh your foods/beverages for a few days to get a more accurate idea of what different amounts of things look like on your plate or in your glass.  - Using smaller plates/glasses also helps in that it tricks our minds into thinking we've eaten more than we have. Studies have shown that we eat more when presented with larger plates, even with the same amount of food on them.   - Plan to eat until you are no longer hungry, rather than until you're full.  For exercise, start with something simple or more enjoyable. You can plan to walk for your half hour or do something like dancing if you enjoy it.  Build up your activity over time; this will make it more enjoyable and reduce your risk of injury.  Here are some videos which offer a variety of different workout classes with different levels: https://[email or web address].com/session/203  It is completely free. If a full video is too much for you to do, you can do them in bite-sized chunks (just pausing when needed and resuming later in the day).   Even if these actions don't ultimately lead to weight loss, increasing your activity will still help to reduce your insulin resistance and will improve your cardiovascular health (making heart attack/stroke less likely as you age).

## 2023-05-05 NOTE — Assessment & Plan Note (Signed)
Recheck today. The 10-year ASCVD risk score (Arnett DK, et al., 2019) is: 2.5%

## 2023-05-05 NOTE — Assessment & Plan Note (Signed)
Fair blood pressure control noted during the visit. Currently on triamterene-hydrochlorothiazide. Discussed monitoring blood pressure. - Continue triamterene-hydrochlorothiazide 37.5-25 mg daily

## 2023-05-05 NOTE — Assessment & Plan Note (Signed)
Physical exam overall unremarkable except as noted above. Routine lab work ordered as noted. Has not received a flu shot in the last four years and has not had a recent COVID-19 booster.  - Discussed the importance of flu and COVID-19 vaccinations. Patient declined flu shot and COVID-19 booster. - Refill citalopram, triamterene-hydrochlorothiazide, and pantoprazole. - Contact insurance to verify coverage for weight loss medications. - Follow up with blood work results.

## 2023-05-05 NOTE — Assessment & Plan Note (Signed)
Reports difficulty with mood and energy levels, impacting ability to exercise. Previously lost weight with phentermine but regained it after discontinuation. Discussed retrying phentermine or considering Wegovy/Zepbound. Discussed potential benefits (improved energy and mood) and risks (increased heart rate and blood pressure) of weight loss medications.    - patient will be checking with insurance regarding Wegovy and Zepbound. - Order metabolic panel, including kidney function, liver function, electrolytes, and cholesterol panel. - Order A1c test due to previous elevated levels. - Order vitamin B12 and vitamin D levels due to reported fatigue.

## 2023-05-05 NOTE — Progress Notes (Signed)
Complete physical exam   Patient: Benjamin Ritter   DOB: 01-Dec-1980   43 y.o. Male  MRN: [id number] Visit Date: 05/05/2023  Today's healthcare provider: Sherlyn Hay, DO   Chief Complaint  Patient presents with   Annual Exam   Subjective    Benjamin Ritter is a 43 y.o. male who presents today for a complete physical exam.  He reports consuming a general diet. The patient does not participate in regular exercise at present. He generally feels well. He reports sleeping well. He does have additional problems to discuss today.  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 He reports a lack of motivation and energy to engage in exercise outside of his physical demands of his job.   He reports recent reduced his appetite after refilling his medications. He has a history of taking phentermine for weight loss, which he found helpful, but has since discontinued.   In terms of overall health, the patient feels healthy but expresses dissatisfaction with his weight. He reports good sleep but has been experiencing increased fatigue since Christmas. He denies any flu or COVID vaccinations in the past four years.  The patient's current medication regimen includes citalopram, triamterene-hydrochlorothiazide, and pantoprazole for reflux. He is taking a combination of 20mg  and 10mg  of sitalopram. He expresses interest in retrying phentermine or trying a weight loss injection, pending insurance coverage.   Past Medical History:  Diagnosis Date   Anxiety    GERD (gastroesophageal reflux disease)    H/O heartburn    Hypertension    Transient elevated blood pressure    Past Surgical History:  Procedure Laterality Date   NO PAST SURGERIES     Social History   Socioeconomic History   Marital status: Married    Spouse name: Not on file   Number of children: Not on file   Years of  education: Not on file   Highest education level: 9th grade  Occupational History   Not on file  Tobacco Use   Smoking status: Never   Smokeless tobacco: Never   Tobacco comments:    Because i dont smoke  Substance and Sexual Activity   Alcohol use: Yes    Alcohol/week: 2.0 standard drinks of alcohol    Types: 2 Standard drinks or equivalent per week   Drug use: No   Sexual activity: Yes  Other Topics Concern   Not on file  Social History Narrative   Not on file   Social Drivers of Health   Financial Resource Strain: Low Risk  (05/01/2023)   Overall Financial Resource Strain (CARDIA)    Difficulty of Paying Living Expenses: Not hard at all  Food Insecurity: No Food Insecurity (05/01/2023)   Hunger Vital Sign    Worried About Running Out of Food in the Last Year: Never true    Ran Out of Food in the Last Year: Never true  Transportation Needs: No Transportation Needs (05/01/2023)   PRAPARE - Administrator, Civil Service (Medical): No    Lack of Transportation (Non-Medical): No  Physical Activity: Insufficiently Active (05/01/2023)   Exercise Vital Sign    Days of Exercise per Week: 3 days    Minutes of Exercise per Session: 30 min  Stress: No Stress Concern Present (05/01/2023)   [employer] of Occupational Health - Occupational Stress Questionnaire    Feeling of  Stress : Not at all  Social Connections: Socially Integrated (05/01/2023)   Social Connection and Isolation Panel [NHANES]    Frequency of Communication with Friends and Family: More than three times a week    Frequency of Social Gatherings with Friends and Family: Twice a week    Attends Religious Services: More than 4 times per year    Active Member of [employer] or Organizations: Yes    Attends Engineer, structural: More than 4 times per year    Marital Status: Married  Catering manager Violence: Not on file   Family Status  Relation Name Status   Father Travin Suppa Alive   Sister  Deanna Alive   Brother  Alive   Daughter Hailey Zagal Alive   MGM Tiny Deceased   PGM Tiny Deceased   Brother  Alive   Brother  Alive   Brother  Alive   Brother  Alive   Brother  Alive   Brother  Alive   Brother [place] Scharf Alive   Sister Holley, age 36y       myastheni gradis Dx at 29   Sister  Alive   Mother  Deceased       Melanoma   PGF  Deceased   MGF  Deceased  No partnership data on file   Family History  Problem Relation Age of Onset   Diabetes Father    Cancer Sister 47       Breast cancer   Healthy Brother    ADD / ADHD Daughter    Cancer Maternal Grandmother        lung   Cancer Paternal Grandmother    Healthy Brother    Healthy Brother    Healthy Brother    Healthy Brother    Healthy Brother    Healthy Brother    Alcohol abuse Brother    Healthy Sister    Healthy Sister    No Known Allergies  Patient Care Team: Sherlyn Hay, DO as PCP - General (Family Medicine)   Medications: Outpatient Medications Prior to Visit  Medication Sig   [DISCONTINUED] citalopram (CELEXA) 10 MG tablet Take 1 tablet (10 mg total) by mouth daily. Take with 20 mg tablet   [DISCONTINUED] citalopram (CELEXA) 20 MG tablet Take 1 tablet (20 mg total) by mouth daily.   [DISCONTINUED] pantoprazole (PROTONIX) 40 MG tablet TAKE 1 TABLET BY MOUTH EVERY DAY   [DISCONTINUED] triamterene-hydrochlorothiazide (MAXZIDE-25) 37.5-25 MG tablet Take 1 tablet by mouth daily.   [DISCONTINUED] doxycycline (VIBRA-TABS) 100 MG tablet Take 1 tablet (100 mg total) by mouth 2 (two) times daily.   [DISCONTINUED] phentermine 37.5 MG capsule Take 1 capsule (37.5 mg total) by mouth every morning. (Patient not taking: Reported on 09/27/2022)   No facility-administered medications prior to visit.    Review of Systems  Constitutional:  Positive for fatigue. Negative for appetite change, chills and fever.  HENT:  Negative for congestion, ear pain, hearing loss, nosebleeds and trouble  swallowing.   Eyes:  Negative for pain and visual disturbance.  Respiratory:  Negative for cough, chest tightness and shortness of breath.   Cardiovascular:  Negative for chest pain, palpitations and leg swelling.  Gastrointestinal:  Negative for abdominal pain, blood in stool, constipation, diarrhea, nausea and vomiting.  Endocrine: Negative for polydipsia, polyphagia and polyuria.  Genitourinary:  Negative for dysuria and flank pain.  Musculoskeletal:  Negative for arthralgias, back pain, joint swelling, myalgias and neck stiffness.  Skin:  Negative for color  change, rash and wound.  Neurological:  Negative for dizziness, tremors, seizures, speech difficulty, weakness, light-headedness and headaches.  Psychiatric/Behavioral:  Negative for behavioral problems, confusion, decreased concentration, dysphoric mood and sleep disturbance. The patient is not nervous/anxious.   All other systems reviewed and are negative.     Objective    BP 135/76 (BP Location: Right Arm, Patient Position: Sitting, Cuff Size: Large)   Pulse 82   Resp 18   Ht 6' 2.5" (1.892 m)   Wt 299 lb 9.6 oz (135.9 kg)   SpO2 98%   BMI 37.95 kg/m    Physical Exam Vitals and nursing note reviewed.  Constitutional:      General: He is awake.     Appearance: Normal appearance.  HENT:     Head: Normocephalic and atraumatic.     Right Ear: Tympanic membrane, ear canal and external ear normal.     Left Ear: Tympanic membrane, ear canal and external ear normal.     Nose: Nose normal.     Mouth/Throat:     Mouth: Mucous membranes are moist.     Pharynx: Oropharynx is clear. No oropharyngeal exudate or posterior oropharyngeal erythema.  Eyes:     General: No scleral icterus.    Extraocular Movements: Extraocular movements intact.     Conjunctiva/sclera: Conjunctivae normal.     Pupils: Pupils are equal, round, and reactive to light.  Neck:     Thyroid: No thyromegaly or thyroid tenderness.  Cardiovascular:      Rate and Rhythm: Normal rate and regular rhythm.     Pulses: Normal pulses.     Heart sounds: Normal heart sounds.  Pulmonary:     Effort: Pulmonary effort is normal. No tachypnea, bradypnea or respiratory distress.     Breath sounds: Normal breath sounds. No stridor. No wheezing, rhonchi or rales.  Abdominal:     General: Bowel sounds are normal. There is no distension.     Palpations: Abdomen is soft. There is no mass.     Tenderness: There is no abdominal tenderness. There is no guarding.     Hernia: No hernia is present.  Musculoskeletal:     Cervical back: Normal range of motion and neck supple.     Right lower leg: No edema.     Left lower leg: No edema.  Lymphadenopathy:     Cervical: No cervical adenopathy.  Skin:    General: Skin is warm and dry.  Neurological:     Mental Status: He is alert and oriented to person, place, and time. Mental status is at baseline.  Psychiatric:        Mood and Affect: Mood normal.        Behavior: Behavior normal.      Last depression screening scores    05/05/2023    8:16 AM 09/27/2022    9:28 AM 05/03/2022    8:15 AM  PHQ 2/9 Scores  PHQ - 2 Score 0 3 0  PHQ- 9 Score 3 7 2    Last fall risk screening    05/05/2023    8:17 AM  Fall Risk   Falls in the past year? 0  Number falls in past yr: 0  Injury with Fall? 0   Last Audit-C alcohol use screening    05/01/2023    6:36 PM  Alcohol Use Disorder Test (AUDIT)  1. How often do you have a drink containing alcohol? 1  2. How many drinks containing alcohol do you have on a  typical day when you are drinking? 0  3. How often do you have six or more drinks on one occasion? 0  AUDIT-C Score 1      Patient-reported   A score of 3 or more in women, and 4 or more in men indicates increased risk for alcohol abuse, EXCEPT if all of the points are from question 1   No results found for any visits on 05/05/23.  Assessment & Plan    Routine Health Maintenance and Physical  Exam  Exercise Activities and Dietary recommendations  Goals      Weight (lb) < 250 lb (113.4 kg)     Increase exercise regiment (with at least 30-60 minutes of moderate-to-high intensity exercise most days of the week).        Immunization History  Administered Date(s) Administered   Influenza Inj Mdck Quad Pf 02/03/2017   Influenza Inj Mdck Quad With Preservative 01/31/2018   Influenza-Unspecified 01/17/2015, 03/20/2018, 02/19/2019   PFIZER Comirnaty(Gray Top)Covid-19 Tri-Sucrose Vaccine 06/05/2020   PFIZER(Purple Top)SARS-COV-2 Vaccination 06/30/2019, 07/22/2019   Tdap 05/23/2016    Health Maintenance  Topic Date Due   INFLUENZA VACCINE  07/17/2023 (Originally 11/17/2022)   COVID-19 Vaccine (4 - 2024-25 season) 12/18/2023 (Originally 12/18/2022)   DTaP/Tdap/Td (2 - Td or Tdap) 05/23/2026   Hepatitis C Screening  Completed   HIV Screening  Completed   HPV VACCINES  Aged Out    Discussed health benefits of physical activity, and encouraged him to engage in regular exercise appropriate for his age and condition.   Annual physical exam Assessment & Plan: Physical exam overall unremarkable except as noted above. Routine lab work ordered as noted. Has not received a flu shot in the last four years and has not had a recent COVID-19 booster.  - Discussed the importance of flu and COVID-19 vaccinations. Patient declined flu shot and COVID-19 booster. - Refill citalopram, triamterene-hydrochlorothiazide, and pantoprazole. - Contact insurance to verify coverage for weight loss medications. - Follow up with blood work results.  Orders: -     Comprehensive metabolic panel -     Hemoglobin A1c -     Lipid panel  Chronic fatigue Assessment & Plan: Reports increased fatigue since Christmas, despite adequate sleep. No associated symptoms like dizziness, lightheadedness, or headaches. Discussed potential causes including vitamin deficiencies and elevated A1c. - Order vitamin B12 and  vitamin D levels.  Orders: -     Vitamin B12 -     VITAMIN D 25 Hydroxy (Vit-D Deficiency, Fractures)  Gastroesophageal reflux disease without esophagitis Assessment & Plan: Refilled pantoprazole.  Orders: -     Pantoprazole Sodium; Take 1 tablet (40 mg total) by mouth daily.  Dispense: 90 tablet; Refill: 3  Morbid obesity (HCC) Assessment & Plan: Reports difficulty with mood and energy levels, impacting ability to exercise. Previously lost weight with phentermine but regained it after discontinuation. Discussed retrying phentermine or considering Wegovy/Zepbound. Discussed potential benefits (improved energy and mood) and risks (increased heart rate and blood pressure) of weight loss medications.    - patient will be checking with insurance regarding Wegovy and Zepbound. - Order metabolic panel, including kidney function, liver function, electrolytes, and cholesterol panel. - Order A1c test due to previous elevated levels. - Order vitamin B12 and vitamin D levels due to reported fatigue.  Orders: -     Phentermine HCl; Take 1 capsule (37.5 mg total) by mouth every morning.  Dispense: 30 capsule; Refill: 2  Essential hypertension Assessment & Plan: Fair blood pressure  control noted during the visit. Currently on triamterene-hydrochlorothiazide. Discussed monitoring blood pressure. - Continue triamterene-hydrochlorothiazide 37.5-25 mg daily  Orders: -     Comprehensive metabolic panel -     Triamterene-HCTZ; Take 1 tablet by mouth daily.  Dispense: 90 tablet; Refill: 3  GAD (generalized anxiety disorder) Assessment & Plan: Stable on citalopram. Continue citalopram 10 mg with citalopram 20 mg (total 30 mg) daily.  Orders: -     Citalopram Hydrobromide; Take 1 tablet (20 mg total) by mouth daily.  Dispense: 90 tablet; Refill: 3 -     Citalopram Hydrobromide; Take 1 tablet (10 mg total) by mouth daily. Take with 20 mg tablet  Dispense: 90 tablet; Refill:  3  Hypertriglyceridemia Assessment & Plan: Recheck today. 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 The 10-year ASCVD risk score (Arnett DK, et al., 2019) is: 2.5%     Return in about 3 months (around 08/03/2023) for Weight; 1 year for annual exam.     I discussed the assessment and treatment plan with the patient  The patient was provided an opportunity to ask questions and all were answered. The patient agreed with the plan and demonstrated an understanding of the instructions.   The patient was advised to call back or seek an in-person evaluation if the symptoms worsen or if the condition fails to improve as anticipated.    Sherlyn Hay, DO  [facility] [phone] (phone) [phone] (fax)  [facility]

## 2023-05-05 NOTE — Assessment & Plan Note (Signed)
Refilled pantoprazole.

## 2023-05-06 LAB — VITAMIN D 25 HYDROXY (VIT D DEFICIENCY, FRACTURES): Vit D, 25-Hydroxy: 34 ng/mL (ref 30.0–100.0)

## 2023-05-06 LAB — COMPREHENSIVE METABOLIC PANEL
ALT: 18 [IU]/L (ref 0–44)
AST: 18 [IU]/L (ref 0–40)
Albumin: 4.6 g/dL (ref 4.1–5.1)
Alkaline Phosphatase: 66 [IU]/L (ref 44–121)
BUN/Creatinine Ratio: 15 (ref 9–20)
BUN: 13 mg/dL (ref 6–24)
Bilirubin Total: 0.4 mg/dL (ref 0.0–1.2)
CO2: 25 mmol/L (ref 20–29)
Calcium: 9.6 mg/dL (ref 8.7–10.2)
Chloride: 100 mmol/L (ref 96–106)
Creatinine, Ser: 0.84 mg/dL (ref 0.76–1.27)
Globulin, Total: 2.8 g/dL (ref 1.5–4.5)
Glucose: 106 mg/dL — ABNORMAL HIGH (ref 70–99)
Potassium: 4.1 mmol/L (ref 3.5–5.2)
Sodium: 140 mmol/L (ref 134–144)
Total Protein: 7.4 g/dL (ref 6.0–8.5)
eGFR: 112 mL/min/{1.73_m2} (ref >59)

## 2023-05-06 LAB — HEMOGLOBIN A1C
Est. average glucose Bld gHb Est-mCnc: 123 mg/dL
Hgb A1c MFr Bld: 5.9 % — ABNORMAL HIGH (ref 4.8–5.6)

## 2023-05-06 LAB — LIPID PANEL
Chol/HDL Ratio: 5.2 {ratio} — ABNORMAL HIGH (ref 0.0–5.0)
Cholesterol, Total: 173 mg/dL (ref 100–199)
HDL: 33 mg/dL — ABNORMAL LOW (ref >39)
LDL Chol Calc (NIH): 97 mg/dL (ref 0–99)
Triglycerides: 251 mg/dL — ABNORMAL HIGH (ref 0–149)
VLDL Cholesterol Cal: 43 mg/dL — ABNORMAL HIGH (ref 5–40)

## 2023-05-06 LAB — VITAMIN B12: Vitamin B-12: 467 pg/mL (ref 232–1245)

## 2023-05-10 MED ORDER — SEMAGLUTIDE-WEIGHT MANAGEMENT 0.5 MG/0.5ML ~~LOC~~ SOAJ: 0.5000 mg | SUBCUTANEOUS | 0 refills | Status: AC

## 2023-05-10 MED ORDER — SEMAGLUTIDE-WEIGHT MANAGEMENT 0.25 MG/0.5ML ~~LOC~~ SOAJ: 0.2500 mg | SUBCUTANEOUS | 0 refills | Status: AC

## 2023-05-10 NOTE — Telephone Encounter (Addendum)
Received a fax from covermymeds for Wegovy 0.25mg /0.80ml  Key:  [id number]  Received a fax from covermymeds for Wegovy 0.5mg /0.18ml  Key:  [id number]

## 2023-05-12 ENCOUNTER — Encounter: Payer: Self-pay | Admitting: Family Medicine

## 2023-05-15 NOTE — Telephone Encounter (Signed)
Recieved a fax from covermymeds for [facility]. A PA form has been started    key: [id number]

## 2023-05-16 NOTE — Telephone Encounter (Signed)
Received a fax from covermymeds for Wegovy  Key:  [id number]

## 2023-05-17 NOTE — Telephone Encounter (Signed)
PA initiated and notes faxed

## 2023-05-25 NOTE — Telephone Encounter (Signed)
 Under media tab we received a letter on 05/19/2023 Wegovy  approved.

## 2023-06-11 ENCOUNTER — Other Ambulatory Visit: Payer: Self-pay | Admitting: Family Medicine

## 2023-07-10 MED ORDER — WEGOVY 1 MG/0.5ML ~~LOC~~ SOAJ: 1.0000 mg | SUBCUTANEOUS | 1 refills | Status: DC

## 2023-09-06 ENCOUNTER — Encounter: Payer: Self-pay | Admitting: Family Medicine

## 2023-09-06 ENCOUNTER — Ambulatory Visit: Payer: PRIVATE HEALTH INSURANCE | Admitting: Family Medicine

## 2023-09-06 DIAGNOSIS — Z713 Dietary counseling and surveillance: Secondary | ICD-10-CM

## 2023-09-06 MED ORDER — SEMAGLUTIDE-WEIGHT MANAGEMENT 1.7 MG/0.75ML ~~LOC~~ SOAJ: 1.7000 mg | SUBCUTANEOUS | 1 refills | Status: DC

## 2023-09-06 MED ORDER — SEMAGLUTIDE-WEIGHT MANAGEMENT 2.4 MG/0.75ML ~~LOC~~ SOAJ: 2.4000 mg | SUBCUTANEOUS | 1 refills | Status: DC

## 2023-09-06 NOTE — Progress Notes (Signed)
 Established patient visit   Patient: Benjamin Ritter   DOB: [birth year]   43 y.o. Male  MRN: [id number] Visit Date: 09/06/2023  Today's healthcare provider: Carlean Charter, DO   Chief Complaint  Patient presents with   Weight Management Screening    Patient was last seen in January for weight loss.  He is currently on Wegovy .  Since January he has lost 33 pounds.   Subjective    HPI Benjamin Ritter is a 43 year old male who presents for follow-up regarding weight management on Wegovy .  He has been on Wegovy  for weight management since January and is currently on a 1 mg dose, having taken his third or fourth shot at this dose. The effectiveness of the medication is variable, with some days feeling ineffective and other days showing effectiveness. Despite this variability, he has experienced weight loss since starting the medication.  He wants to increase the dose of Wegovy  to enhance its effectiveness. No significant changes in energy levels, with energy described as 'about the same' as before starting the medication.      Medications: Outpatient Medications Prior to Visit  Medication Sig   citalopram  (CELEXA ) 10 MG tablet Take 1 tablet (10 mg total) by mouth daily. Take with 20 mg tablet   citalopram  (CELEXA ) 20 MG tablet Take 1 tablet (20 mg total) by mouth daily.   pantoprazole  (PROTONIX ) 40 MG tablet Take 1 tablet (40 mg total) by mouth daily.   triamterene -hydrochlorothiazide (MAXZIDE-25) 37.5-25 MG tablet Take 1 tablet by mouth daily.   [DISCONTINUED] Semaglutide -Weight Management (WEGOVY ) 1 MG/0.5ML SOAJ Inject 1 mg into the skin once a week.   phentermine  37.5 MG capsule Take 1 capsule (37.5 mg total) by mouth every morning. (Patient not taking: Reported on 09/06/2023)   No facility-administered medications prior to visit.        Objective    BP 123/89 (BP Location: Right Arm, Patient Position: Sitting, Cuff Size: Normal)   Pulse 86   Temp 98.3 F (36.8 C)  (Oral)   Ht 6\' 2"  (1.88 m)   Wt 266 lb (120.7 kg)   SpO2 98%   BMI 34.15 kg/m     Physical Exam Vitals and nursing note reviewed.  Constitutional:      General: He is not in acute distress.    Appearance: Normal appearance.  HENT:     Head: Normocephalic and atraumatic.  Eyes:     General: No scleral icterus.    Conjunctiva/sclera: Conjunctivae normal.  Cardiovascular:     Rate and Rhythm: Normal rate.  Pulmonary:     Effort: Pulmonary effort is normal.  Neurological:     Mental Status: He is alert and oriented to person, place, and time. Mental status is at baseline.  Psychiatric:        Mood and Affect: Mood normal.        Behavior: Behavior normal.      No results found for any visits on 09/06/23.  Assessment & Plan    Morbid obesity (HCC) -     Semaglutide -Weight Management; Inject 1.7 mg into the skin once a week.  Dispense: 3 mL; Refill: 1 -     Semaglutide -Weight Management; Inject 2.4 mg into the skin once a week.  Dispense: 3 mL; Refill: 1  Weight loss counseling, encounter for -     Semaglutide -Weight Management; Inject 1.7 mg into the skin once a week.  Dispense: 3 mL; Refill: 1 -  Semaglutide -Weight Management; Inject 2.4 mg into the skin once a week.  Dispense: 3 mL; Refill: 1    Morbid obesity; weight loss counseling Currently on Wegovy  1 mg with variable effectiveness. Interested in dose increase.  Consider switch to Zepbound if weight loss stalls and BMI remains elevated. - Increase Wegovy  dose to next level. - Send refill for increased dose, in case it is not well-tolerated; and delayed fill for next dose, for patient to switch to if he is tolerating it well. - Monitor weight loss over next three months. - Consider Zepbound if weight loss stalls and BMI remains elevated, pending insurance approval.    Return in about 3 months (around 12/07/2023) for Weight.      I discussed the assessment and treatment plan with the patient  The patient was  provided an opportunity to ask questions and all were answered. The patient agreed with the plan and demonstrated an understanding of the instructions.   The patient was advised to call back or seek an in-person evaluation if the symptoms worsen or if the condition fails to improve as anticipated.    Carlean Charter, DO  [facility] [phone] (phone) [phone] (fax)  [facility]

## 2023-10-09 ENCOUNTER — Other Ambulatory Visit (HOSPITAL_COMMUNITY): Payer: Self-pay

## 2023-10-09 ENCOUNTER — Telehealth: Payer: Self-pay

## 2023-10-09 NOTE — Telephone Encounter (Signed)
 Pharmacy Patient Advocate Encounter   Received notification from CoverMyMeds that prior authorization for Wegovy  1.7MG /0.75ML auto-injectorsis required/requested.   Insurance verification completed.   The patient is insured through [facility] .   Per test claim: PA required; PA submitted to above mentioned insurance via CoverMyMeds Key/confirmation #/EOC (Key: [id number])     Status is pending

## 2023-10-12 NOTE — Telephone Encounter (Signed)
 Pharmacy Patient Advocate Encounter  Received notification from [facility] that Prior Authorization for Wegovy  has been APPROVED from 10/09/2023 to 10/08/2024   PA #/Case ID/Reference #: [id number]

## 2023-10-13 ENCOUNTER — Encounter: Payer: Self-pay | Admitting: Family Medicine

## 2023-11-03 DIAGNOSIS — D485 Neoplasm of uncertain behavior of skin: Secondary | ICD-10-CM | POA: Diagnosis not present

## 2023-11-03 DIAGNOSIS — R208 Other disturbances of skin sensation: Secondary | ICD-10-CM | POA: Diagnosis not present

## 2023-11-03 DIAGNOSIS — R58 Hemorrhage, not elsewhere classified: Secondary | ICD-10-CM | POA: Diagnosis not present

## 2023-12-07 ENCOUNTER — Encounter: Payer: Self-pay | Admitting: Family Medicine

## 2023-12-07 ENCOUNTER — Ambulatory Visit: Payer: PRIVATE HEALTH INSURANCE | Admitting: Family Medicine

## 2023-12-07 VITALS — BP 122/83 | HR 78 | Temp 98.3°F | Ht 74.0 in | Wt 262.7 lb

## 2023-12-07 DIAGNOSIS — M25511 Pain in right shoulder: Secondary | ICD-10-CM | POA: Diagnosis not present

## 2023-12-07 DIAGNOSIS — Z7689 Persons encountering health services in other specified circumstances: Secondary | ICD-10-CM

## 2023-12-07 MED ORDER — PREDNISONE 20 MG PO TABS: ORAL_TABLET | ORAL | 0 refills | Status: AC

## 2023-12-07 MED ORDER — SEMAGLUTIDE-WEIGHT MANAGEMENT 2.4 MG/0.75ML ~~LOC~~ SOAJ: 2.4000 mg | SUBCUTANEOUS | 5 refills | Status: DC

## 2023-12-07 NOTE — Progress Notes (Signed)
 Established patient visit   Patient: Benjamin Ritter   DOB: 03/19/1981   43 y.o. Male  MRN: [id number] Visit Date: 12/07/2023  Today's healthcare provider: LAURAINE LOISE BUOY, DO   Chief Complaint  Patient presents with   Weight Check    Patient is here for a weight check, he is currently taking the medication Wegovy  for weight loss.  Reports that it has been doing pretty good since being prescribed the medication.  States that he didn't really feel anything out of it for the last few shots meaning no curve in appetite.   Follow-up    Right shoulder on acromion process has been hurting for about 3 weeks now and it burns to touch.  Even with clothing it is uncomfortable.  Declined all vaccines.   Subjective    HPI [name] is a 43 year old male who presents with shoulder pain and burning sensation.  He has been experiencing a burning sensation in his right shoulder for approximately three to four weeks. The sensation is described as similar to a sunburn but internal, starting suddenly upon waking. 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 The burning sensation radiates from the front of the shoulder to the back and intensifies with movements such as lifting his shoulder or putting his arm behind his head.   He has a history of a broken collarbone on the right side, which occurred at age 89. He suspects that holding his three-month-old child while sleeping may have contributed to the discomfort. The burning sensation affects his ability to perform daily activities, such as holding his baby with a bottle, and he finds some relief by using a pillow to support his arm.  He is currently on Wegovy , with a current (recently increased) dose of 2.4 mg weekly, and has lost 37 pounds since January. He has two or three doses left of the current prescription. His insurance stopped covering the medication as of July 1st,  2025, which has been a challenge.  Neck pain is present intermittently, but not currently. Burning sensation in the right shoulder worsens with movement and throughout the day.      Medications: Outpatient Medications Prior to Visit  Medication Sig   citalopram  (CELEXA ) 10 MG tablet Take 1 tablet (10 mg total) by mouth daily. Take with 20 mg tablet   citalopram  (CELEXA ) 20 MG tablet Take 1 tablet (20 mg total) by mouth daily.   pantoprazole  (PROTONIX ) 40 MG tablet Take 1 tablet (40 mg total) by mouth daily.   triamterene -hydrochlorothiazide (MAXZIDE-25) 37.5-25 MG tablet Take 1 tablet by mouth daily.   [DISCONTINUED] Semaglutide -Weight Management 1.7 MG/0.75ML SOAJ Inject 1.7 mg into the skin once a week.   [DISCONTINUED] Semaglutide -Weight Management 2.4 MG/0.75ML SOAJ Inject 2.4 mg into the skin once a week.   [DISCONTINUED] phentermine  37.5 MG capsule Take 1 capsule (37.5 mg total) by mouth every morning. (Patient not taking: Reported on 12/07/2023)   No facility-administered medications prior to visit.        Objective    BP 122/83 (BP Location: Left Arm, Patient Position: Sitting, Cuff Size: Normal)   Pulse 78   Temp 98.3 F (36.8 C) (Oral)   Ht 6' 2 (1.88 m)   Wt 262 lb 11.2 oz (119.2 kg)   SpO2 99%   BMI 33.73 kg/m     Physical Exam Vitals and nursing note reviewed.  Constitutional:  General: He is not in acute distress.    Appearance: Normal appearance.  HENT:     Head: Normocephalic and atraumatic.  Eyes:     General: No scleral icterus.    Conjunctiva/sclera: Conjunctivae normal.  Cardiovascular:     Rate and Rhythm: Normal rate.  Pulmonary:     Effort: Pulmonary effort is normal.  Musculoskeletal:     Right shoulder: Bony tenderness (AC joint) present. Decreased range of motion. Decreased strength.     Left shoulder: Normal.       Arms:     Comments: Right shoulder burning sensation all over right shoulder as noted with point of worst pain  marked by x (at [facility] joint). Pain with abduction (unable to abduct above horizontal plane of shoulder) and with internal rotation.  Neurological:     Mental Status: He is alert and oriented to person, place, and time. Mental status is at baseline.  Psychiatric:        Mood and Affect: Mood normal.        Behavior: Behavior normal.      No results found for any visits on 12/07/23.  Assessment & Plan    Acute pain of right shoulder -     Ambulatory referral to Orthopedic Surgery -     predniSONE ; Take 60mg  PO daily x 2 days, then40mg  PO daily x 2 days, then 20mg  PO daily x 3 days  Dispense: 13 tablet; Refill: 0  Morbid obesity (HCC) -     Semaglutide -Weight Management; Inject 2.4 mg into the skin once a week.  Dispense: 3 mL; Refill: 5  Encounter for weight management -     Semaglutide -Weight Management; Inject 2.4 mg into the skin once a week.  Dispense: 3 mL; Refill: 5      Acute right shoulder pain, likely rotator cuff pathology Right shoulder pain with burning sensation for 3-4 weeks, difficulty with abduction and internal rotation, likely rotator cuff pathology. Pain impacts daily activities. - Refer to orthopedics for evaluation and management. - Prescribe prednisone  pack for inflammation and pain, cautioning about increased hunger. - Advise gentle range of motion exercises. - Discuss potential for physical therapy; patient prefers to wait for orthopedic evaluation and recommendations.  Obesity (BMI 30.0-34.9); weight management Significant weight loss of 37 pounds with Wegovy , now on dose of 2.4 mg.  Potential insurance coverage issues for Wegovy , but prior authorization valid for 1.7 mg dose through 10/08/2024. - Send 2.4 mg dose of Wegovy . - If patient unable to get to clinic for dose, may send Wegovy  1.7 mg dose, given prior authorization, though it is unclear if the authorization is still valid given insurance opting to not cover Wegovy  for weight loss anymore as of October 17, 2023. - Monitor weight and hunger, especially with prednisone  use. - Continue to focus on caloric deficit diet and on getting at least 150 minutes of moderate intensity physical activity per week    Return in about 6 months (around 06/08/2024) for CPE, Weight.      I discussed the assessment and treatment plan with the patient  The patient was provided an opportunity to ask questions and all were answered. The patient agreed with the plan and demonstrated an understanding of the instructions.   The patient was advised to call back or seek an in-person evaluation if the symptoms worsen or if the condition fails to improve as anticipated.    LAURAINE LOISE BUOY, DO  [facility] [phone] (phone) [phone] (  fax)  [facility]

## 2024-01-01 DIAGNOSIS — M67813 Other specified disorders of tendon, right shoulder: Secondary | ICD-10-CM | POA: Diagnosis not present

## 2024-01-01 DIAGNOSIS — S42021A Displaced fracture of shaft of right clavicle, initial encounter for closed fracture: Secondary | ICD-10-CM | POA: Diagnosis not present

## 2024-02-08 ENCOUNTER — Other Ambulatory Visit: Payer: Self-pay | Admitting: Family Medicine

## 2024-02-08 DIAGNOSIS — I10 Essential (primary) hypertension: Secondary | ICD-10-CM

## 2024-02-26 DIAGNOSIS — M5412 Radiculopathy, cervical region: Secondary | ICD-10-CM | POA: Diagnosis not present

## 2024-03-01 ENCOUNTER — Ambulatory Visit: Attending: Physician Assistant

## 2024-03-01 DIAGNOSIS — M542 Cervicalgia: Secondary | ICD-10-CM | POA: Insufficient documentation

## 2024-03-01 DIAGNOSIS — M5413 Radiculopathy, cervicothoracic region: Secondary | ICD-10-CM | POA: Insufficient documentation

## 2024-03-01 DIAGNOSIS — M25511 Pain in right shoulder: Secondary | ICD-10-CM | POA: Diagnosis not present

## 2024-03-01 NOTE — Therapy (Signed)
 OUTPATIENT PHYSICAL THERAPY CERVICAL EVALUATION   Patient Name: Benjamin Ritter MRN: [id number] DOB:30-Aug-1980, 43 y.o., male Today's Date: 03/01/2024  END OF SESSION:  PT End of Session - 03/01/24 0949     Visit Number 1    Number of Visits 17    Date for Recertification  04/26/24    PT Start Time 0950    PT Stop Time 1034    PT Time Calculation (min) 44 min    Activity Tolerance Patient tolerated treatment well    Behavior During Therapy [facility] for tasks assessed/performed          Past Medical History:  Diagnosis Date   Anxiety    GERD (gastroesophageal reflux disease)    H/O heartburn    Hypertension    Transient elevated blood pressure    Past Surgical History:  Procedure Laterality Date   NO PAST SURGERIES     Patient Active Problem List   Diagnosis Date Noted   Chronic fatigue 05/05/2023   Elevated hemoglobin A1c 05/05/2023   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 and abnormally low high density lipoprotein cholesterol 05/05/2023   GAD (generalized anxiety disorder) 05/05/2023   GERD (gastroesophageal reflux disease) 05/03/2022   Essential hypertension 05/03/2022   Morbid obesity (HCC) 05/03/2022   Hypertriglyceridemia 05/03/2022   Anxiety 03/29/2021   Syncope 06/09/2020   Episodic lightheadedness 06/09/2020   Annual physical exam 10/22/2019    PCP: Donzella Lauraine SAILOR, DO   REFERRING PROVIDER: Stuart Nat CROME, PA-C  REFERRING DIAG: M54.2 (ICD-10-CM) - Neck pain  THERAPY DIAG:  Cervicalgia - Plan: PT plan of care cert/re-cert  Right shoulder pain, unspecified chronicity - Plan: PT plan of care cert/re-cert  Radiculopathy, cervicothoracic region - Plan: PT plan of care cert/re-cert  Rationale for Evaluation and Treatment: Rehabilitation  ONSET DATE: 02/28/2024 (Date PT referral signed)  SUBJECTIVE:                                                                                                                                                                                                          SUBJECTIVE STATEMENT: R pectoralis, axilla, anterior arm: 10/10 at worst for the past 3-4 months    Hand dominance: Right  PERTINENT HISTORY:  Cervicalgia. Neck is not bothering him at all. Feels tingling and numbness R anterior chest, axilla, and atnerior arm.  Had a cortisone shot R shoulder which had not effect. Symptoms began a couple of months ago, sudden onset, unknown mechanism of injury.    Uses construction type machines, radio producer, working for  the city of [place], [place]. Does a lot of manual stuff and heavy lifting.   Has a hx of fx of R collar bone when he was 43 years old but lost an inch off the R collar bone while healing.   Pt is R hand dominant.   Was given HEP such as shoulder extension with band (blue) which did not help.     Blood pressure is controlled.  No latex allergies   PAIN:  Are you having pain? Yes: NPRS scale: 5/10 Pain location: R pectoralis, axilla, anterior arm. Pain description: tingling and numbness, occasional sharpness Aggravating factors: donning and doffing clothes, anything that brushes that area, reaching (shoulder joint) Relieving factors: massage for the shoulder joint. Nothing helps the numbness.   PRECAUTIONS: no known precautions.   RED FLAGS: Bowel or bladder incontinence: No and Cauda equina syndrome: No     WEIGHT BEARING RESTRICTIONS: No  FALLS:  Has patient fallen in last 6 months? 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   PLOF: Independent  PATIENT GOALS: get the numbness to go away.   NEXT MD VISIT: Spine specialist December 2025.   OBJECTIVE:  Note: Objective measures were completed at Evaluation unless otherwise noted.  DIAGNOSTIC FINDINGS:  XR Shoulder 3 Or More Views Right 01/01/2024  Procedure Note  Arlana Velma Lynwood Marsa, MD - 01/01/2024 Formatting of this  note might be different from the original. EXAM: XR SHOULDER 3 OR MORE VIEWS RIGHT DATE: 01/01/2024 11:05 AM ACCESSION: [id number] UN DICTATED: 01/01/2024 11:06 AM INTERPRETATION LOCATION: [facility]  CLINICAL INDICATION: 43 years old Male with shoulder pain  - M25.511 - Chronic right shoulder pain - G89.29 - Chronic right shoulder pain    COMPARISON: None.  TECHNIQUE: AP, Grashey, outlet, and axillary views of the right shoulder.  FINDINGS: Remote radiographically healed midshaft clavicle fracture. No evidence for acute fracture. Joint alignment is normal. Joint spaces are normal. No focal soft tissue swelling. Visualized right lung is clear.  IMPRESSION: Remote radiographically healed right clavicle fracture. No acute osseous abnormality. Exam End: 01/01/24 11:05   Specimen Collected: 01/01/24 11:06 Last Resulted: 01/01/24 11:07  Received From: [facility]  Result Received: 02/29/24 13:34       PATIENT SURVEYS:  NDI:  NECK DISABILITY INDEX  Date:03/01/2024 Score  Pain intensity 4 = The pain is very severe at the moment  2. Personal care (washing, dressing, etc.) 0 = I can look after myself normally without causing extra pain  3. Lifting 0 =  I can lift heavy weights without extra pain  4. Reading 0 = I can read as much as I want to with no pain in my neck  5. Headaches 0 = I have no headaches at all  6. Concentration 0 =  I can concentrate fully when I want to with no difficulty  7. Work 0 =  I can do as much work as I want to  8. Driving 0 = I can drive my car without any neck pain  9. Sleeping 0 = I have no trouble sleeping  10. Recreation 0 = I am able to engage in all my recreation activities with no neck pain at all  Total 4/50 (8%)    Minimum Detectable Change (90% confidence): 5 points or 10% points  COGNITION: Overall cognitive status: Within functional limits for tasks assessed  SENSATION: Tingling R pectoralis area  POSTURE: forward neck, B protracted  shoulders, movement  preference around C5/6 area, R shoulder lower, thoracic kyphosis, slight R thoracic rotation, R lumbar convexity around L3/4 area  PALPATION: TTP R first rib area with decreased mobility  Muscle tension B upper trap area     CERVICAL ROM:   Active ROM A/PROM (deg) eval  Flexion full  Extension WFL  Right lateral flexion WFL  Left lateral flexion WFL with R lateral cervical tightness  Right rotation  WFL  Left rotation WFL with R posterior lateral neck tightness   (Blank rows = not tested)  UPPER EXTREMITY ROM:  Active ROM Right eval Left eval  Shoulder flexion 99 with pain (102 AAROM with stiffness at end range and anterior R shoulder joint pain   Shoulder extension    Shoulder abduction    Shoulder adduction    Shoulder extension    Shoulder internal rotation    Shoulder external rotation    Elbow flexion    Elbow extension    Wrist flexion    Wrist extension    Wrist ulnar deviation    Wrist radial deviation    Wrist pronation    Wrist supination     (Blank rows = not tested)  UPPER EXTREMITY MMT:  MMT Right eval Left eval  Shoulder flexion 4+ 4  Shoulder extension    Shoulder abduction 4 4  Shoulder adduction    Shoulder extension    Shoulder internal rotation 4+ 4+  Shoulder external rotation 4 4+  Middle trapezius 4-   Lower trapezius 4   Elbow flexion 4 4  Elbow extension 5 5  Wrist flexion    Wrist extension 4+ 4+  Wrist ulnar deviation    Wrist radial deviation    Wrist pronation    Wrist supination    Grip strength     (Blank rows = not tested)  CERVICAL SPECIAL TESTS:  Spurling's test: Negative.  Increased R pectoralis symptoms with cervical distraction  (+) Empty can R shoulder   FUNCTIONAL TESTS:    TREATMENT DATE: 03/01/2024                                                                                                                               Neuromuscular re education.   Seated  Chin tuck 10x5  seconds    B scapular retraction 10x3   Reviewed HEP   Improved exercise technique, movement at target joints, use of target muscles after mod verbal, visual, tactile cues.     PATIENT EDUCATION:  Education details: there-ex, HEP, POC Person educated: Patient Education method: Explanation, Demonstration, Tactile cues, Verbal cues, and Handouts Education comprehension: verbalized understanding and returned demonstration  HOME EXERCISE PROGRAM: Access Code: [email or web address] URL: [email or web address] Date: 03/01/2024 Prepared by: Emil Glassman  Exercises - Seated Cervical Retraction  - 3 x daily - 7 x weekly - 3 sets - 10 reps - 5 seconds hold - Seated Scapular Retraction  - 3 x daily - 7 x weekly -  3 sets - 10 reps  ASSESSMENT:  CLINICAL IMPRESSION: Patient is a 43 y.o. male who was seen today for physical therapy evaluation and treatment for Neck pain.  Pt currently demonstrates symptoms in R pectoralis, axilla, anterior arm, as well as R shoulder joint pain; altered posture (Cervical protraction, thoracic kyphosis, B scapular protraction), limited R shoulder AROM with reproduction of joint pain, B scapular and R shoulder ER weakness, positive special tests suggesting cervical and R shoulder joint involvement, and difficulty performing tasks which involve reaching as well as donning and doffing clothes secondary to pain. Pt will benefit from skilled physical therapy services to address the aforementioned deficits.   OBJECTIVE IMPAIRMENTS: decreased ROM, decreased strength, improper body mechanics, postural dysfunction, and pain.   ACTIVITY LIMITATIONS: lifting, dressing, reach over head, and hygiene/grooming  PARTICIPATION LIMITATIONS:   PERSONAL FACTORS: Fitness, Profession, Time since onset of injury/illness/exacerbation, and 1-2 comorbidities: anxiety, HTN are also affecting patient's functional outcome.   REHAB POTENTIAL: Fair    CLINICAL DECISION MAKING:  Stable/uncomplicated  EVALUATION COMPLEXITY: Low   GOALS: Goals reviewed with patient? Yes  SHORT TERM GOALS: Target date: 03/15/2024  Pt will be independent with his initial HEP to decrease pain, symptoms, improve strength, function, ability to reach and don and doff clothes more comfortably.  Baseline: Pt has started his initial HEP (03/01/2024) Goal status: INITIAL   LONG TERM GOALS: Target date: 04/26/2024  Pt will have a decrease in R pectoralis, axilla, and anterior arm pain/paresthesia to 2/10 or less at worst to promote ability to reach, as well as don and doff clothes more comfortably.  Baseline: R pectoralis, axilla, anterior arm: 10/10 at worst for the past 3-4 months (03/01/2024) Goal status: INITIAL  2.  Pt will improve his NDI score to 2% or less as a demonstration of improved function (03/01/2024) Baseline: 8% (03/01/2024) Goal status: INITIAL  3.  Pt will improve B lower trap and middle trap and R ER strength to promote ability to reach, as well as don and doff clothes more comfortably.  Baseline:  MMT Right eval Left eval  Shoulder abduction 4 4  Shoulder external rotation 4 4+  Middle trapezius 4-   Lower trapezius 4    Goal status: INITIAL  4.  Pt will improve R shoulder flexion AROM to at least 140 degrees to promote ability to reach more comfortably.  Baseline:  Active ROM Right eval  Shoulder flexion 99 with pain (102 AAROM with stiffness at end range and anterior R shoulder joint pain   Goal status: INITIAL    PLAN:  PT FREQUENCY: 1-2x/week  PT DURATION: 8 weeks  PLANNED INTERVENTIONS: 97110-Therapeutic exercises, 97530- Therapeutic activity, 97112- Neuromuscular re-education, 97535- Self Care, 02859- Manual therapy, G0283- Electrical stimulation (unattended), [phone]- Traction (mechanical), [id number]- Ionotophoresis 4mg /ml Dexamethasone, 79439 (1-2 muscles), 20561 (3+ muscles)- Dry Needling, Patient/Family education, Joint mobilization, and Spinal  mobilization  PLAN FOR NEXT SESSION: Posture, thoracic extension, scapular and ER strengthening, anterior cervical strengthening, manual techniques, modalities PRN   Jaymeson Mengel, PT, DPT 03/01/2024, 12:02 PM

## 2024-03-04 ENCOUNTER — Telehealth: Payer: Self-pay

## 2024-03-04 ENCOUNTER — Ambulatory Visit

## 2024-03-04 NOTE — Telephone Encounter (Signed)
 No show. Called patient who said that he did not check his phone this morning. Thought that he only had one PT appointment this week. Will be able to make it to his appointment this Wednesday.

## 2024-03-06 ENCOUNTER — Ambulatory Visit

## 2024-03-06 DIAGNOSIS — M542 Cervicalgia: Secondary | ICD-10-CM

## 2024-03-06 DIAGNOSIS — M5413 Radiculopathy, cervicothoracic region: Secondary | ICD-10-CM | POA: Diagnosis not present

## 2024-03-06 DIAGNOSIS — M25511 Pain in right shoulder: Secondary | ICD-10-CM | POA: Diagnosis not present

## 2024-03-06 NOTE — Therapy (Signed)
 OUTPATIENT PHYSICAL THERAPY TREATMENT  Patient Name: Benjamin Ritter MRN: [id number] DOB:03/16/1981, 43 y.o., male Today's Date: 03/06/2024  END OF SESSION:  PT End of Session - 03/06/24 1121     Visit Number 2    Number of Visits 17    Date for Recertification  04/26/24    PT Start Time 1121    PT Stop Time 1219    PT Time Calculation (min) 58 min    Activity Tolerance Patient tolerated treatment well    Behavior During Therapy WFL for tasks assessed/performed           Past Medical History:  Diagnosis Date   Anxiety    GERD (gastroesophageal reflux disease)    H/O heartburn    Hypertension    Transient elevated blood pressure    Past Surgical History:  Procedure Laterality Date   NO PAST SURGERIES     Patient Active Problem List   Diagnosis Date Noted   Chronic fatigue 05/05/2023   Elevated hemoglobin A1c 05/05/2023   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 and abnormally low high density lipoprotein cholesterol 05/05/2023   GAD (generalized anxiety disorder) 05/05/2023   GERD (gastroesophageal reflux disease) 05/03/2022   Essential hypertension 05/03/2022   Morbid obesity (HCC) 05/03/2022   Hypertriglyceridemia 05/03/2022   Anxiety 03/29/2021   Syncope 06/09/2020   Episodic lightheadedness 06/09/2020   Annual physical exam 10/22/2019    PCP: Donzella Lauraine SAILOR, DO   REFERRING PROVIDER: Stuart Nat CROME, PA-C  REFERRING DIAG: M54.2 (ICD-10-CM) - Neck pain  THERAPY DIAG:  Cervicalgia  Right shoulder pain, unspecified chronicity  Radiculopathy, cervicothoracic region  Rationale for Evaluation and Treatment: Rehabilitation  ONSET DATE: 02/28/2024 (Date PT referral signed)  SUBJECTIVE:                                                                                                                                                                                                         SUBJECTIVE STATEMENT: R pectoralis,  axilla, anterior arm:  bad currently. Pain has been in his forearm the past couple of days (ulnar nerve distribution). Tinling and numbness of about a 9/10 today.      Hand dominance: Right  PERTINENT HISTORY:  Cervicalgia. Neck is not bothering him at all. Feels tingling and numbness R anterior chest, axilla, and atnerior arm.  Had a cortisone shot R shoulder which had not effect. Symptoms began a couple of months ago, sudden onset, unknown mechanism of injury.    Uses construction type machines, radio producer, working for the city of  [place], [place]. Does a lot of manual stuff and heavy lifting.   Has a hx of fx of R collar bone when he was 43 years old but lost an inch off the R collar bone while healing.   Pt is R hand dominant.   Was given HEP such as shoulder extension with band (blue) which did not help.     Blood pressure is controlled.  No latex allergies   PAIN:  Are you having pain? Yes: NPRS scale: 5/10 Pain location: R pectoralis, axilla, anterior arm. Pain description: tingling and numbness, occasional sharpness Aggravating factors: donning and doffing clothes, anything that brushes that area, reaching (shoulder joint) Relieving factors: massage for the shoulder joint. Nothing helps the numbness.   PRECAUTIONS: no known precautions.   RED FLAGS: Bowel or bladder incontinence: No and Cauda equina syndrome: No     WEIGHT BEARING RESTRICTIONS: No  FALLS:  Has patient fallen in last 6 months? 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   PLOF: Independent  PATIENT GOALS: get the numbness to go away.   NEXT MD VISIT: Spine specialist December 2025.   OBJECTIVE:  Note: Objective measures were completed at Evaluation unless otherwise noted.  DIAGNOSTIC FINDINGS:  XR Shoulder 3 Or More Views Right 01/01/2024  Procedure Note  Arlana Velma Lynwood Marsa, MD - 01/01/2024 Formatting of this note might  be different from the original. EXAM: XR SHOULDER 3 OR MORE VIEWS RIGHT DATE: 01/01/2024 11:05 AM ACCESSION: [id number] UN DICTATED: 01/01/2024 11:06 AM INTERPRETATION LOCATION: [facility]  CLINICAL INDICATION: 43 years old Male with shoulder pain  - M25.511 - Chronic right shoulder pain - G89.29 - Chronic right shoulder pain    COMPARISON: None.  TECHNIQUE: AP, Grashey, outlet, and axillary views of the right shoulder.  FINDINGS: Remote radiographically healed midshaft clavicle fracture. No evidence for acute fracture. Joint alignment is normal. Joint spaces are normal. No focal soft tissue swelling. Visualized right lung is clear.  IMPRESSION: Remote radiographically healed right clavicle fracture. No acute osseous abnormality. Exam End: 01/01/24 11:05   Specimen Collected: 01/01/24 11:06 Last Resulted: 01/01/24 11:07  Received From: [facility]  Result Received: 02/29/24 13:34       PATIENT SURVEYS:  NDI:  NECK DISABILITY INDEX  Date:03/01/2024 Score  Pain intensity 4 = The pain is very severe at the moment  2. Personal care (washing, dressing, etc.) 0 = I can look after myself normally without causing extra pain  3. Lifting 0 =  I can lift heavy weights without extra pain  4. Reading 0 = I can read as much as I want to with no pain in my neck  5. Headaches 0 = I have no headaches at all  6. Concentration 0 =  I can concentrate fully when I want to with no difficulty  7. Work 0 =  I can do as much work as I want to  8. Driving 0 = I can drive my car without any neck pain  9. Sleeping 0 = I have no trouble sleeping  10. Recreation 0 = I am able to engage in all my recreation activities with no neck pain at all  Total 4/50 (8%)    Minimum Detectable Change (90% confidence): 5 points or 10% points  COGNITION: Overall cognitive status: Within functional limits for tasks assessed  SENSATION: Tingling R pectoralis area  POSTURE: forward neck, B protracted shoulders,  movement preference around C5/6  area, R shoulder lower, thoracic kyphosis, slight R thoracic rotation, R lumbar convexity around L3/4 area  PALPATION: TTP R first rib area with decreased mobility  Muscle tension B upper trap area     CERVICAL ROM:   Active ROM A/PROM (deg) eval  Flexion full  Extension WFL  Right lateral flexion WFL  Left lateral flexion WFL with R lateral cervical tightness  Right rotation  WFL  Left rotation WFL with R posterior lateral neck tightness   (Blank rows = not tested)  UPPER EXTREMITY ROM:  Active ROM Right eval Left eval  Shoulder flexion 99 with pain (102 AAROM with stiffness at end range and anterior R shoulder joint pain   Shoulder extension    Shoulder abduction    Shoulder adduction    Shoulder extension    Shoulder internal rotation    Shoulder external rotation    Elbow flexion    Elbow extension    Wrist flexion    Wrist extension    Wrist ulnar deviation    Wrist radial deviation    Wrist pronation    Wrist supination     (Blank rows = not tested)  UPPER EXTREMITY MMT:  MMT Right eval Left eval  Shoulder flexion 4+ 4  Shoulder extension    Shoulder abduction 4 4  Shoulder adduction    Shoulder extension    Shoulder internal rotation 4+ 4+  Shoulder external rotation 4 4+  Middle trapezius 4-   Lower trapezius 4   Elbow flexion 4 4  Elbow extension 5 5  Wrist flexion    Wrist extension 4+ 4+  Wrist ulnar deviation    Wrist radial deviation    Wrist pronation    Wrist supination    Grip strength     (Blank rows = not tested)  CERVICAL SPECIAL TESTS:  Spurling's test: Negative.  Increased R pectoralis symptoms with cervical distraction  (+) Empty can R shoulder   FUNCTIONAL TESTS:    TREATMENT DATE: 03/06/2024                                                                                                                                Manual therapy Seated caudal glide R first rib grade 3 to  promote mobility  Slight decrease in R ulnar nerve symptoms.  Seated STM R levator scapula area to improve fascial mobility and decrease muscle tension   Supine with R shoulder abduction, posterior and inferior glide humeral head grade 3 to 3+ to promote mobility  stiff   Neuromuscular re education.   R first rib stretch with strap with contralateral cervical side bend 30 seconds x 3  R first rib stretch with strap with R and L cervical rotation 10x2  No change in R UE paresthesia  Supine  cervical nod 10x3  B scapular retraction 10x3  Increased R UE symptoms   Cervical nod 10x3 with 5 second holds  B  scapular retraction 10x5 seconds   Chin tuck 10x5 seconds for 2 sets  Seated scapular depression 10x3  Pt education on R scalene nerve pain referral pattern, R scalene and brachial plexus and ulnar nerve, irritability of symptoms, R glenohumeral joint stiffness, and R UE pain relation. Pt verbalized understanding.    Improved exercise technique, movement at target joints, use of target muscles after mod verbal, visual, tactile cues.     PATIENT EDUCATION:  Education details: there-ex, HEP, POC Person educated: Patient Education method: Explanation, Demonstration, Tactile cues, Verbal cues, and Handouts Education comprehension: verbalized understanding and returned demonstration  HOME EXERCISE PROGRAM: Access Code: [email or web address] URL: [email or web address] Date: 03/01/2024 Prepared by: Emil Glassman  Exercises - Seated Cervical Retraction  - 3 x daily - 7 x weekly - 3 sets - 10 reps - 5 seconds hold   ASSESSMENT:  CLINICAL IMPRESSION: Irritability of symptoms. Worked on on improving R shoulder joint mobility to decrease stress to R UE nerves. Also worked on gentle anterior cervical muscle activation and scapular depressor muscle activation to help decrease scalene muscle tension to R brachial plexus. Slight decrease R forearm ulnar nerve symptoms after manual  therapy to improve R first rib mobility. No change in symptoms with first rib stretch with strap. Fair tolerance to today's session. Pt demonstrates irritability of symptoms. Pt will benefit from continued skilled physical therapy services to decrease pain, improve strength and function.    OBJECTIVE IMPAIRMENTS: decreased ROM, decreased strength, improper body mechanics, postural dysfunction, and pain.   ACTIVITY LIMITATIONS: lifting, dressing, reach over head, and hygiene/grooming  PARTICIPATION LIMITATIONS:   PERSONAL FACTORS: Fitness, Profession, Time since onset of injury/illness/exacerbation, and 1-2 comorbidities: anxiety, HTN are also affecting patient's functional outcome.   REHAB POTENTIAL: Fair    CLINICAL DECISION MAKING: Stable/uncomplicated  EVALUATION COMPLEXITY: Low   GOALS: Goals reviewed with patient? Yes  SHORT TERM GOALS: Target date: 03/15/2024  Pt will be independent with his initial HEP to decrease pain, symptoms, improve strength, function, ability to reach and don and doff clothes more comfortably.  Baseline: Pt has started his initial HEP (03/01/2024) Goal status: INITIAL   LONG TERM GOALS: Target date: 04/26/2024  Pt will have a decrease in R pectoralis, axilla, and anterior arm pain/paresthesia to 2/10 or less at worst to promote ability to reach, as well as don and doff clothes more comfortably.  Baseline: R pectoralis, axilla, anterior arm: 10/10 at worst for the past 3-4 months (03/01/2024) Goal status: INITIAL  2.  Pt will improve his NDI score to 2% or less as a demonstration of improved function (03/01/2024) Baseline: 8% (03/01/2024) Goal status: INITIAL  3.  Pt will improve B lower trap and middle trap and R ER strength to promote ability to reach, as well as don and doff clothes more comfortably.  Baseline:  MMT Right eval Left eval  Shoulder abduction 4 4  Shoulder external rotation 4 4+  Middle trapezius 4-   Lower trapezius 4     Goal status: INITIAL  4.  Pt will improve R shoulder flexion AROM to at least 140 degrees to promote ability to reach more comfortably.  Baseline:  Active ROM Right eval  Shoulder flexion 99 with pain (102 AAROM with stiffness at end range and anterior R shoulder joint pain   Goal status: INITIAL    PLAN:  PT FREQUENCY: 1-2x/week  PT DURATION: 8 weeks  PLANNED INTERVENTIONS: 97110-Therapeutic exercises, 97530- Therapeutic activity, [id number]- Neuromuscular re-education, 97535- Self Care, [id number]- Manual therapy, [id number]-  Electrical stimulation (unattended), [id number]- Traction (mechanical), 02966- Ionotophoresis 4mg /ml Dexamethasone, 20560 (1-2 muscles), 20561 (3+ muscles)- Dry Needling, Patient/Family education, Joint mobilization, and Spinal mobilization  PLAN FOR NEXT SESSION: Posture, thoracic extension, scapular and ER strengthening, anterior cervical strengthening, manual techniques, modalities PRN   Andreka Stucki, PT, DPT 03/06/2024, 12:41 PM

## 2024-03-18 ENCOUNTER — Ambulatory Visit: Attending: Physician Assistant

## 2024-03-18 DIAGNOSIS — M5413 Radiculopathy, cervicothoracic region: Secondary | ICD-10-CM | POA: Insufficient documentation

## 2024-03-18 DIAGNOSIS — M25511 Pain in right shoulder: Secondary | ICD-10-CM | POA: Diagnosis not present

## 2024-03-18 DIAGNOSIS — M542 Cervicalgia: Secondary | ICD-10-CM | POA: Insufficient documentation

## 2024-03-18 NOTE — Therapy (Signed)
 OUTPATIENT PHYSICAL THERAPY TREATMENT  Patient Name: Benjamin Ritter MRN: [id number] DOB:[birth year], 43 y.o., male Today's Date: 03/18/2024  END OF SESSION:  PT End of Session - 03/18/24 1357     Visit Number 3    Number of Visits 17    Date for Recertification  04/26/24    PT Start Time 1357   Pt arrived late   PT Stop Time 1436    PT Time Calculation (min) 39 min    Activity Tolerance Patient tolerated treatment well    Behavior During Therapy [facility] for tasks assessed/performed            Past Medical History:  Diagnosis Date   Anxiety    GERD (gastroesophageal reflux disease)    H/O heartburn    Hypertension    Transient elevated blood pressure    Past Surgical History:  Procedure Laterality Date   NO PAST SURGERIES     Patient Active Problem List   Diagnosis Date Noted   Chronic fatigue 05/05/2023   Elevated hemoglobin A1c 05/05/2023   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 and abnormally low high density lipoprotein cholesterol 05/05/2023   GAD (generalized anxiety disorder) 05/05/2023   GERD (gastroesophageal reflux disease) 05/03/2022   Essential hypertension 05/03/2022   Morbid obesity (HCC) 05/03/2022   Hypertriglyceridemia 05/03/2022   Anxiety 03/29/2021   Syncope 06/09/2020   Episodic lightheadedness 06/09/2020   Annual physical exam 10/22/2019    PCP: Donzella Lauraine SAILOR, DO   REFERRING PROVIDER: Stuart Nat CROME, PA-C  REFERRING DIAG: M54.2 (ICD-10-CM) - Neck pain  THERAPY DIAG:  Cervicalgia  Right shoulder pain, unspecified chronicity  Radiculopathy, cervicothoracic region  Rationale for Evaluation and Treatment: Rehabilitation  ONSET DATE: 02/28/2024 (Date PT referral signed)  SUBJECTIVE:                                                                                                                                                                                                         SUBJECTIVE  STATEMENT: The R forearm is better. Everything else is the same. The R inferior medial arm is tender. 7/10 currently. Was sore after last session. Did not do much on Thanksgiving         Hand dominance: Right  PERTINENT HISTORY:  Cervicalgia. Neck is not bothering him at all. Feels tingling and numbness R anterior chest, axilla, and atnerior arm.  Had a cortisone shot R shoulder which had not effect. Symptoms began a couple of months ago, sudden onset, unknown mechanism of injury.    Uses construction type  machines, landscaping machines, working for the city of [place], [place]. Does a lot of manual stuff and heavy lifting.   Has a hx of fx of R collar bone when he was 43 years old but lost an inch off the R collar bone while healing.   Pt is R hand dominant.   Was given HEP such as shoulder extension with band (blue) which did not help.     Blood pressure is controlled.  No latex allergies   PAIN:  Are you having pain? Yes: NPRS scale: 5/10 Pain location: R pectoralis, axilla, anterior arm. Pain description: tingling and numbness, occasional sharpness Aggravating factors: donning and doffing clothes, anything that brushes that area, reaching (shoulder joint) Relieving factors: massage for the shoulder joint. Nothing helps the numbness.   PRECAUTIONS: no known precautions.   RED FLAGS: Bowel or bladder incontinence: No and Cauda equina syndrome: No     WEIGHT BEARING RESTRICTIONS: No  FALLS:  Has patient fallen in last 6 months? 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   PLOF: Independent  PATIENT GOALS: get the numbness to go away.   NEXT MD VISIT: Spine specialist December 2025.   OBJECTIVE:  Note: Objective measures were completed at Evaluation unless otherwise noted.  DIAGNOSTIC FINDINGS:  XR Shoulder 3 Or More Views Right 01/01/2024  Procedure Note  Arlana Velma Lynwood Marsa, MD -  01/01/2024 Formatting of this note might be different from the original. EXAM: XR SHOULDER 3 OR MORE VIEWS RIGHT DATE: 01/01/2024 11:05 AM ACCESSION: [id number] UN DICTATED: 01/01/2024 11:06 AM INTERPRETATION LOCATION: [facility]  CLINICAL INDICATION: 43 years old Male with shoulder pain  - M25.511 - Chronic right shoulder pain - G89.29 - Chronic right shoulder pain    COMPARISON: None.  TECHNIQUE: AP, Grashey, outlet, and axillary views of the right shoulder.  FINDINGS: Remote radiographically healed midshaft clavicle fracture. No evidence for acute fracture. Joint alignment is normal. Joint spaces are normal. No focal soft tissue swelling. Visualized right lung is clear.  IMPRESSION: Remote radiographically healed right clavicle fracture. No acute osseous abnormality. Exam End: 01/01/24 11:05   Specimen Collected: 01/01/24 11:06 Last Resulted: 01/01/24 11:07  Received From: [facility]  Result Received: 02/29/24 13:34       PATIENT SURVEYS:  NDI:  NECK DISABILITY INDEX  Date:03/01/2024 Score  Pain intensity 4 = The pain is very severe at the moment  2. Personal care (washing, dressing, etc.) 0 = I can look after myself normally without causing extra pain  3. Lifting 0 =  I can lift heavy weights without extra pain  4. Reading 0 = I can read as much as I want to with no pain in my neck  5. Headaches 0 = I have no headaches at all  6. Concentration 0 =  I can concentrate fully when I want to with no difficulty  7. Work 0 =  I can do as much work as I want to  8. Driving 0 = I can drive my car without any neck pain  9. Sleeping 0 = I have no trouble sleeping  10. Recreation 0 = I am able to engage in all my recreation activities with no neck pain at all  Total 4/50 (8%)    Minimum Detectable Change (90% confidence): 5 points or 10% points  COGNITION: Overall cognitive status: Within functional limits for tasks assessed  SENSATION: Tingling R pectoralis  area  POSTURE: forward  neck, B protracted shoulders, movement preference around C5/6 area, R shoulder lower, thoracic kyphosis, slight R thoracic rotation, R lumbar convexity around L3/4 area  PALPATION: TTP R first rib area with decreased mobility  Muscle tension B upper trap area     CERVICAL ROM:   Active ROM A/PROM (deg) eval  Flexion full  Extension WFL  Right lateral flexion WFL  Left lateral flexion WFL with R lateral cervical tightness  Right rotation  WFL  Left rotation WFL with R posterior lateral neck tightness   (Blank rows = not tested)  UPPER EXTREMITY ROM:  Active ROM Right eval Left eval  Shoulder flexion 99 with pain (102 AAROM with stiffness at end range and anterior R shoulder joint pain   Shoulder extension    Shoulder abduction    Shoulder adduction    Shoulder extension    Shoulder internal rotation    Shoulder external rotation    Elbow flexion    Elbow extension    Wrist flexion    Wrist extension    Wrist ulnar deviation    Wrist radial deviation    Wrist pronation    Wrist supination     (Blank rows = not tested)  UPPER EXTREMITY MMT:  MMT Right eval Left eval  Shoulder flexion 4+ 4  Shoulder extension    Shoulder abduction 4 4  Shoulder adduction    Shoulder extension    Shoulder internal rotation 4+ 4+  Shoulder external rotation 4 4+  Middle trapezius 4-   Lower trapezius 4   Elbow flexion 4 4  Elbow extension 5 5  Wrist flexion    Wrist extension 4+ 4+  Wrist ulnar deviation    Wrist radial deviation    Wrist pronation    Wrist supination    Grip strength     (Blank rows = not tested)  CERVICAL SPECIAL TESTS:  Spurling's test: Negative.  Increased R pectoralis symptoms with cervical distraction  (+) Empty can R shoulder   FUNCTIONAL TESTS:    TREATMENT DATE: 03/18/2024                                                                                                                               Electrical  stimulation 15 minutes High Volt Setting for pain control  Channel 1: R upper trap muscle at 110 V Channel 2: R anterior and posterior shoulder at 120 V  Decreased neck pain No change in shoulder/pectoralis pain  Neuromuscular re education  Standing R shoulder ER AROM 10x   Then with yellow band ER    R at pain free range 10x3  Standing R triceps extension isometrics, wrist at table 10x5 seconds  Increased R medial arm tenderness  Standing chin tucks 10x5 seconds for 2 sets  Then with R and L cervical rotation 10x2 each direction.    Improved exercise technique, movement at target joints, use of target muscles after mod verbal, visual,  tactile cues.      PATIENT EDUCATION:  Education details: there-ex, HEP, POC Person educated: Patient Education method: Explanation, Demonstration, Tactile cues, Verbal cues, and Handouts Education comprehension: verbalized understanding and returned demonstration  HOME EXERCISE PROGRAM: Access Code: [email or web address] URL: [email or web address] Date: 03/01/2024 Prepared by: Emil Glassman  Exercises - Seated Cervical Retraction  - 3 x daily - 7 x weekly - 3 sets - 10 reps - 5 seconds hold   ASSESSMENT:  CLINICAL IMPRESSION:  Utilized High Volt Electrical stimulation to R shoulder and posterior neck secondary to irritability of symptoms for pain control. Decreased neck pain, R shoulder/arm pain remained unchanged. Worked on promoting posterior and inferior humeral head glide via muscle activation to decrease pressure to R UE nerves. Also worked on thoracic extension/mobility to decrease stress to UE nerves. Fair tolerance to today's session. Pt will benefit from continued skilled physical therapy services to decrease pain, improve strength and function.      OBJECTIVE IMPAIRMENTS: decreased ROM, decreased strength, improper body mechanics, postural dysfunction, and pain.   ACTIVITY LIMITATIONS: lifting, dressing, reach over head,  and hygiene/grooming  PARTICIPATION LIMITATIONS:   PERSONAL FACTORS: Fitness, Profession, Time since onset of injury/illness/exacerbation, and 1-2 comorbidities: anxiety, HTN are also affecting patient's functional outcome.   REHAB POTENTIAL: Fair    CLINICAL DECISION MAKING: Stable/uncomplicated  EVALUATION COMPLEXITY: Low   GOALS: Goals reviewed with patient? Yes  SHORT TERM GOALS: Target date: 03/15/2024  Pt will be independent with his initial HEP to decrease pain, symptoms, improve strength, function, ability to reach and don and doff clothes more comfortably.  Baseline: Pt has started his initial HEP (03/01/2024) Goal status: INITIAL   LONG TERM GOALS: Target date: 04/26/2024  Pt will have a decrease in R pectoralis, axilla, and anterior arm pain/paresthesia to 2/10 or less at worst to promote ability to reach, as well as don and doff clothes more comfortably.  Baseline: R pectoralis, axilla, anterior arm: 10/10 at worst for the past 3-4 months (03/01/2024) Goal status: INITIAL  2.  Pt will improve his NDI score to 2% or less as a demonstration of improved function (03/01/2024) Baseline: 8% (03/01/2024) Goal status: INITIAL  3.  Pt will improve B lower trap and middle trap and R ER strength to promote ability to reach, as well as don and doff clothes more comfortably.  Baseline:  MMT Right eval Left eval  Shoulder abduction 4 4  Shoulder external rotation 4 4+  Middle trapezius 4-   Lower trapezius 4    Goal status: INITIAL  4.  Pt will improve R shoulder flexion AROM to at least 140 degrees to promote ability to reach more comfortably.  Baseline:  Active ROM Right eval  Shoulder flexion 99 with pain (102 AAROM with stiffness at end range and anterior R shoulder joint pain   Goal status: INITIAL    PLAN:  PT FREQUENCY: 1-2x/week  PT DURATION: 8 weeks  PLANNED INTERVENTIONS: 97110-Therapeutic exercises, 97530- Therapeutic activity, 97112- Neuromuscular  re-education, 97535- Self Care, 02859- Manual therapy, G0283- Electrical stimulation (unattended), [phone]- Traction (mechanical), [id number]- Ionotophoresis 4mg /ml Dexamethasone, 79439 (1-2 muscles), 20561 (3+ muscles)- Dry Needling, Patient/Family education, Joint mobilization, and Spinal mobilization  PLAN FOR NEXT SESSION: Posture, thoracic extension, scapular and ER strengthening, anterior cervical strengthening, manual techniques, modalities PRN   Batya Citron, PT, DPT 03/18/2024, 3:25 PM

## 2024-03-26 ENCOUNTER — Ambulatory Visit

## 2024-03-26 DIAGNOSIS — M5413 Radiculopathy, cervicothoracic region: Secondary | ICD-10-CM

## 2024-03-26 DIAGNOSIS — M542 Cervicalgia: Secondary | ICD-10-CM

## 2024-03-26 DIAGNOSIS — M25511 Pain in right shoulder: Secondary | ICD-10-CM | POA: Diagnosis not present

## 2024-03-26 NOTE — Therapy (Signed)
 OUTPATIENT PHYSICAL THERAPY TREATMENT  Patient Name: Benjamin Ritter MRN: [id number] DOB:07-14-1980, 43 y.o., male Today's Date: 03/26/2024  END OF SESSION:  PT End of Session - 03/26/24 1119     Visit Number 4    Number of Visits 17    Date for Recertification  04/26/24    PT Start Time 1119    PT Stop Time 1202    PT Time Calculation (min) 43 min    Activity Tolerance Patient tolerated treatment well    Behavior During Therapy WFL for tasks assessed/performed             Past Medical History:  Diagnosis Date   Anxiety    GERD (gastroesophageal reflux disease)    H/O heartburn    Hypertension    Transient elevated blood pressure    Past Surgical History:  Procedure Laterality Date   NO PAST SURGERIES     Patient Active Problem List   Diagnosis Date Noted   Chronic fatigue 05/05/2023   Elevated hemoglobin A1c 05/05/2023   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 and abnormally low high density lipoprotein cholesterol 05/05/2023   GAD (generalized anxiety disorder) 05/05/2023   GERD (gastroesophageal reflux disease) 05/03/2022   Essential hypertension 05/03/2022   Morbid obesity (HCC) 05/03/2022   Hypertriglyceridemia 05/03/2022   Anxiety 03/29/2021   Syncope 06/09/2020   Episodic lightheadedness 06/09/2020   Annual physical exam 10/22/2019    PCP: Donzella Lauraine SAILOR, DO   REFERRING PROVIDER: Stuart Nat CROME, PA-C  REFERRING DIAG: M54.2 (ICD-10-CM) - Neck pain  THERAPY DIAG:  Cervicalgia  Right shoulder pain, unspecified chronicity  Radiculopathy, cervicothoracic region  Rationale for Evaluation and Treatment: Rehabilitation  ONSET DATE: 02/28/2024 (Date PT referral signed)  SUBJECTIVE:                                                                                                                                                                                                         SUBJECTIVE STATEMENT: R neck and UE  are about the same. The R forearm comes and goes. 6-7/10 R UE pain currently. Symptoms bothered him after the E-Stim last session.      Hand dominance: Right  PERTINENT HISTORY:  Cervicalgia. Neck is not bothering him at all. Feels tingling and numbness R anterior chest, axilla, and atnerior arm.  Had a cortisone shot R shoulder which had not effect. Symptoms began a couple of months ago, sudden onset, unknown mechanism of injury.    Uses construction type machines, radio producer, working for the city of [place],  [place]. Does a lot of manual stuff and heavy lifting.   Has a hx of fx of R collar bone when he was 42 years old but lost an inch off the R collar bone while healing.   Pt is R hand dominant.   Was given HEP such as shoulder extension with band (blue) which did not help.     Blood pressure is controlled.  No latex allergies   PAIN:  Are you having pain? Yes: NPRS scale: 5/10 Pain location: R pectoralis, axilla, anterior arm. Pain description: tingling and numbness, occasional sharpness Aggravating factors: donning and doffing clothes, anything that brushes that area, reaching (shoulder joint) Relieving factors: massage for the shoulder joint. Nothing helps the numbness.   PRECAUTIONS: no known precautions.   RED FLAGS: Bowel or bladder incontinence: No and Cauda equina syndrome: No     WEIGHT BEARING RESTRICTIONS: No  FALLS:  Has patient fallen in last 6 months? 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   PLOF: Independent  PATIENT GOALS: get the numbness to go away.   NEXT MD VISIT: Spine specialist December 2025.   OBJECTIVE:  Note: Objective measures were completed at Evaluation unless otherwise noted.  DIAGNOSTIC FINDINGS:  XR Shoulder 3 Or More Views Right 01/01/2024  Procedure Note  Arlana Velma Lynwood Marsa, MD - 01/01/2024 Formatting of this note might be different from the  original. EXAM: XR SHOULDER 3 OR MORE VIEWS RIGHT DATE: 01/01/2024 11:05 AM ACCESSION: [id number] UN DICTATED: 01/01/2024 11:06 AM INTERPRETATION LOCATION: [facility]  CLINICAL INDICATION: 43 years old Male with shoulder pain  - M25.511 - Chronic right shoulder pain - G89.29 - Chronic right shoulder pain    COMPARISON: None.  TECHNIQUE: AP, Grashey, outlet, and axillary views of the right shoulder.  FINDINGS: Remote radiographically healed midshaft clavicle fracture. No evidence for acute fracture. Joint alignment is normal. Joint spaces are normal. No focal soft tissue swelling. Visualized right lung is clear.  IMPRESSION: Remote radiographically healed right clavicle fracture. No acute osseous abnormality. Exam End: 01/01/24 11:05   Specimen Collected: 01/01/24 11:06 Last Resulted: 01/01/24 11:07  Received From: [facility]  Result Received: 02/29/24 13:34       PATIENT SURVEYS:  NDI:  NECK DISABILITY INDEX  Date:03/01/2024 Score  Pain intensity 4 = The pain is very severe at the moment  2. Personal care (washing, dressing, etc.) 0 = I can look after myself normally without causing extra pain  3. Lifting 0 =  I can lift heavy weights without extra pain  4. Reading 0 = I can read as much as I want to with no pain in my neck  5. Headaches 0 = I have no headaches at all  6. Concentration 0 =  I can concentrate fully when I want to with no difficulty  7. Work 0 =  I can do as much work as I want to  8. Driving 0 = I can drive my car without any neck pain  9. Sleeping 0 = I have no trouble sleeping  10. Recreation 0 = I am able to engage in all my recreation activities with no neck pain at all  Total 4/50 (8%)    Minimum Detectable Change (90% confidence): 5 points or 10% points  COGNITION: Overall cognitive status: Within functional limits for tasks assessed  SENSATION: Tingling R pectoralis area  POSTURE: forward neck, B protracted shoulders, movement preference  around C5/6 area,  R shoulder lower, thoracic kyphosis, slight R thoracic rotation, R lumbar convexity around L3/4 area  PALPATION: TTP R first rib area with decreased mobility  Muscle tension B upper trap area     CERVICAL ROM:   Active ROM A/PROM (deg) eval  Flexion full  Extension WFL  Right lateral flexion WFL  Left lateral flexion WFL with R lateral cervical tightness  Right rotation  WFL  Left rotation WFL with R posterior lateral neck tightness   (Blank rows = not tested)  UPPER EXTREMITY ROM:  Active ROM Right eval Left eval  Shoulder flexion 99 with pain (102 AAROM with stiffness at end range and anterior R shoulder joint pain   Shoulder extension    Shoulder abduction    Shoulder adduction    Shoulder extension    Shoulder internal rotation    Shoulder external rotation    Elbow flexion    Elbow extension    Wrist flexion    Wrist extension    Wrist ulnar deviation    Wrist radial deviation    Wrist pronation    Wrist supination     (Blank rows = not tested)  UPPER EXTREMITY MMT:  MMT Right eval Left eval  Shoulder flexion 4+ 4  Shoulder extension    Shoulder abduction 4 4  Shoulder adduction    Shoulder extension    Shoulder internal rotation 4+ 4+  Shoulder external rotation 4 4+  Middle trapezius 4-   Lower trapezius 4   Elbow flexion 4 4  Elbow extension 5 5  Wrist flexion    Wrist extension 4+ 4+  Wrist ulnar deviation    Wrist radial deviation    Wrist pronation    Wrist supination    Grip strength     (Blank rows = not tested)  CERVICAL SPECIAL TESTS:  Spurling's test: Negative.  Increased R pectoralis symptoms with cervical distraction  (+) Empty can R shoulder   FUNCTIONAL TESTS:    TREATMENT DATE: 03/26/2024                                                                                                                                Manual therapy  Seated STM R and L cervical paraspinal muscles, R and L rhomboid and  upper trap muscles to decrease tension  Seated caudal glide R first rib to promote mobility and decrease R scalene tension grade 3  Seated STM R pectoralis muscle to decrease tension  Increased symptoms.    Improved exercise technique, movement at target joints, use of target muscles after mod verbal, visual, tactile cues.     Neuromuscular re education  Seated chin tuck 10x5 seconds for 3 sets  Seated B scapular retraction 10x5 seconds for 3 sets  R anterior forearm symptoms.   Seated manually resisted R scapular depression isometrics 10x5 seconds for 2 sets   Improved exercise technique, movement at target joints, use of target muscles  after mod verbal, visual, tactile cues.      PATIENT EDUCATION:  Education details: there-ex, HEP, POC Person educated: Patient Education method: Explanation, Demonstration, Tactile cues, Verbal cues, and Handouts Education comprehension: verbalized understanding and returned demonstration  HOME EXERCISE PROGRAM: Access Code: [email or web address] URL: [email or web address] Date: 03/01/2024 Prepared by: Emil Glassman  Exercises - Seated Cervical Retraction  - 3 x daily - 7 x weekly - 3 sets - 10 reps - 5 seconds hold   ASSESSMENT:  CLINICAL IMPRESSION: Focused predominantly on manual techniques to decrease muscle tension to neck, R thoracic outlet, and pectoralis to help decrease stress to R UE nerves. Also worked on gentle upper thoracic extension and gentle scapular strengthening to decrease stress to lower cervical spine. No change in symptoms overall. Pt will benefit from continued skilled physical therapy services to decrease pain, improve strength and function.      OBJECTIVE IMPAIRMENTS: decreased ROM, decreased strength, improper body mechanics, postural dysfunction, and pain.   ACTIVITY LIMITATIONS: lifting, dressing, reach over head, and hygiene/grooming  PARTICIPATION LIMITATIONS:   PERSONAL FACTORS: Fitness,  Profession, Time since onset of injury/illness/exacerbation, and 1-2 comorbidities: anxiety, HTN are also affecting patient's functional outcome.   REHAB POTENTIAL: Fair    CLINICAL DECISION MAKING: Stable/uncomplicated  EVALUATION COMPLEXITY: Low   GOALS: Goals reviewed with patient? Yes  SHORT TERM GOALS: Target date: 03/15/2024  Pt will be independent with his initial HEP to decrease pain, symptoms, improve strength, function, ability to reach and don and doff clothes more comfortably.  Baseline: Pt has started his initial HEP (03/01/2024) Goal status: INITIAL   LONG TERM GOALS: Target date: 04/26/2024  Pt will have a decrease in R pectoralis, axilla, and anterior arm pain/paresthesia to 2/10 or less at worst to promote ability to reach, as well as don and doff clothes more comfortably.  Baseline: R pectoralis, axilla, anterior arm: 10/10 at worst for the past 3-4 months (03/01/2024) Goal status: INITIAL  2.  Pt will improve his NDI score to 2% or less as a demonstration of improved function (03/01/2024) Baseline: 8% (03/01/2024) Goal status: INITIAL  3.  Pt will improve B lower trap and middle trap and R ER strength to promote ability to reach, as well as don and doff clothes more comfortably.  Baseline:  MMT Right eval Left eval  Shoulder abduction 4 4  Shoulder external rotation 4 4+  Middle trapezius 4-   Lower trapezius 4    Goal status: INITIAL  4.  Pt will improve R shoulder flexion AROM to at least 140 degrees to promote ability to reach more comfortably.  Baseline:  Active ROM Right eval  Shoulder flexion 99 with pain (102 AAROM with stiffness at end range and anterior R shoulder joint pain   Goal status: INITIAL    PLAN:  PT FREQUENCY: 1-2x/week  PT DURATION: 8 weeks  PLANNED INTERVENTIONS: 97110-Therapeutic exercises, 97530- Therapeutic activity, 97112- Neuromuscular re-education, 97535- Self Care, 02859- Manual therapy, G0283- Electrical  stimulation (unattended), [phone]- Traction (mechanical), [id number]- Ionotophoresis 4mg /ml Dexamethasone, 79439 (1-2 muscles), 20561 (3+ muscles)- Dry Needling, Patient/Family education, Joint mobilization, and Spinal mobilization  PLAN FOR NEXT SESSION: Posture, thoracic extension, scapular and ER strengthening, anterior cervical strengthening, manual techniques, modalities PRN   Darina Hartwell, PT, DPT 03/26/2024, 12:17 PM

## 2024-03-29 ENCOUNTER — Ambulatory Visit

## 2024-03-29 ENCOUNTER — Other Ambulatory Visit: Payer: Self-pay | Admitting: Family Medicine

## 2024-03-29 ENCOUNTER — Telehealth: Payer: Self-pay

## 2024-03-29 DIAGNOSIS — Z7689 Persons encountering health services in other specified circumstances: Secondary | ICD-10-CM

## 2024-03-29 NOTE — Telephone Encounter (Signed)
 No show. Called patient who said that he was busy at work and forgot. Will be able to make it to his sessions next week.

## 2024-04-02 ENCOUNTER — Ambulatory Visit

## 2024-04-02 DIAGNOSIS — M5413 Radiculopathy, cervicothoracic region: Secondary | ICD-10-CM | POA: Diagnosis not present

## 2024-04-02 DIAGNOSIS — M542 Cervicalgia: Secondary | ICD-10-CM | POA: Diagnosis not present

## 2024-04-02 DIAGNOSIS — M25511 Pain in right shoulder: Secondary | ICD-10-CM

## 2024-04-02 NOTE — Therapy (Signed)
 OUTPATIENT PHYSICAL THERAPY TREATMENT  Patient Name: Benjamin Ritter MRN: [id number] DOB:07/08/1980, 43 y.o., male Today's Date: 04/02/2024  END OF SESSION:  PT End of Session - 04/02/24 0813     Visit Number 5    Number of Visits 17    Date for Recertification  04/26/24    PT Start Time 0813    PT Stop Time 0901    PT Time Calculation (min) 48 min    Activity Tolerance Patient tolerated treatment well    Behavior During Therapy [facility] for tasks assessed/performed              Past Medical History:  Diagnosis Date   Anxiety    GERD (gastroesophageal reflux disease)    H/O heartburn    Hypertension    Transient elevated blood pressure    Past Surgical History:  Procedure Laterality Date   NO PAST SURGERIES     Patient Active Problem List   Diagnosis Date Noted   Chronic fatigue 05/05/2023   Elevated hemoglobin A1c 05/05/2023   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 and abnormally low high density lipoprotein cholesterol 05/05/2023   GAD (generalized anxiety disorder) 05/05/2023   GERD (gastroesophageal reflux disease) 05/03/2022   Essential hypertension 05/03/2022   Morbid obesity (HCC) 05/03/2022   Hypertriglyceridemia 05/03/2022   Anxiety 03/29/2021   Syncope 06/09/2020   Episodic lightheadedness 06/09/2020   Annual physical exam 10/22/2019    PCP: Donzella Lauraine SAILOR, DO   REFERRING PROVIDER: Stuart Nat CROME, PA-C  REFERRING DIAG: M54.2 (ICD-10-CM) - Neck pain  THERAPY DIAG:  Cervicalgia  Right shoulder pain, unspecified chronicity  Radiculopathy, cervicothoracic region  Rationale for Evaluation and Treatment: Rehabilitation  ONSET DATE: 02/28/2024 (Date PT referral signed)  SUBJECTIVE:                                                                                                                                                                                                         SUBJECTIVE STATEMENT: R shoulder  and neck is not too bad, about 4/10 currently. Usually increases around lunch time. Was very sore after last session.       Hand dominance: Right  PERTINENT HISTORY:  Cervicalgia. Neck is not bothering him at all. Feels tingling and numbness R anterior chest, axilla, and atnerior arm.  Had a cortisone shot R shoulder which had not effect. Symptoms began a couple of months ago, sudden onset, unknown mechanism of injury.    Uses construction type machines, radio producer, working for the city of [place], [place]. Does a  lot of manual stuff and heavy lifting.   Has a hx of fx of R collar bone when he was 43 years old but lost an inch off the R collar bone while healing.   Pt is R hand dominant.   Was given HEP such as shoulder extension with band (blue) which did not help.     Blood pressure is controlled.  No latex allergies   PAIN:  Are you having pain? Yes: NPRS scale: 5/10 Pain location: R pectoralis, axilla, anterior arm. Pain description: tingling and numbness, occasional sharpness Aggravating factors: donning and doffing clothes, anything that brushes that area, reaching (shoulder joint) Relieving factors: massage for the shoulder joint. Nothing helps the numbness.   PRECAUTIONS: no known precautions.   RED FLAGS: Bowel or bladder incontinence: No and Cauda equina syndrome: No     WEIGHT BEARING RESTRICTIONS: No  FALLS:  Has patient fallen in last 6 months? 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   PLOF: Independent  PATIENT GOALS: get the numbness to go away.   NEXT MD VISIT: Spine specialist December 2025.   OBJECTIVE:  Note: Objective measures were completed at Evaluation unless otherwise noted.  DIAGNOSTIC FINDINGS:  XR Shoulder 3 Or More Views Right 01/01/2024  Procedure Note  Arlana Velma Lynwood Marsa, MD - 01/01/2024 Formatting of this note might be different from the original. EXAM: XR  SHOULDER 3 OR MORE VIEWS RIGHT DATE: 01/01/2024 11:05 AM ACCESSION: [id number] UN DICTATED: 01/01/2024 11:06 AM INTERPRETATION LOCATION: [facility]  CLINICAL INDICATION: 43 years old Male with shoulder pain  - M25.511 - Chronic right shoulder pain - G89.29 - Chronic right shoulder pain    COMPARISON: None.  TECHNIQUE: AP, Grashey, outlet, and axillary views of the right shoulder.  FINDINGS: Remote radiographically healed midshaft clavicle fracture. No evidence for acute fracture. Joint alignment is normal. Joint spaces are normal. No focal soft tissue swelling. Visualized right lung is clear.  IMPRESSION: Remote radiographically healed right clavicle fracture. No acute osseous abnormality. Exam End: 01/01/24 11:05   Specimen Collected: 01/01/24 11:06 Last Resulted: 01/01/24 11:07  Received From: [facility]  Result Received: 02/29/24 13:34       PATIENT SURVEYS:  NDI:  NECK DISABILITY INDEX  Date:03/01/2024 Score  Pain intensity 4 = The pain is very severe at the moment  2. Personal care (washing, dressing, etc.) 0 = I can look after myself normally without causing extra pain  3. Lifting 0 =  I can lift heavy weights without extra pain  4. Reading 0 = I can read as much as I want to with no pain in my neck  5. Headaches 0 = I have no headaches at all  6. Concentration 0 =  I can concentrate fully when I want to with no difficulty  7. Work 0 =  I can do as much work as I want to  8. Driving 0 = I can drive my car without any neck pain  9. Sleeping 0 = I have no trouble sleeping  10. Recreation 0 = I am able to engage in all my recreation activities with no neck pain at all  Total 4/50 (8%)    Minimum Detectable Change (90% confidence): 5 points or 10% points  COGNITION: Overall cognitive status: Within functional limits for tasks assessed  SENSATION: Tingling R pectoralis area  POSTURE: forward neck, B protracted shoulders, movement preference around C5/6 area, R  shoulder lower,  thoracic kyphosis, slight R thoracic rotation, R lumbar convexity around L3/4 area  PALPATION: TTP R first rib area with decreased mobility  Muscle tension B upper trap area     CERVICAL ROM:   Active ROM A/PROM (deg) eval  Flexion full  Extension WFL  Right lateral flexion WFL  Left lateral flexion WFL with R lateral cervical tightness  Right rotation  WFL  Left rotation WFL with R posterior lateral neck tightness   (Blank rows = not tested)  UPPER EXTREMITY ROM:  Active ROM Right eval Left eval  Shoulder flexion 99 with pain (102 AAROM with stiffness at end range and anterior R shoulder joint pain   Shoulder extension    Shoulder abduction    Shoulder adduction    Shoulder extension    Shoulder internal rotation    Shoulder external rotation    Elbow flexion    Elbow extension    Wrist flexion    Wrist extension    Wrist ulnar deviation    Wrist radial deviation    Wrist pronation    Wrist supination     (Blank rows = not tested)  UPPER EXTREMITY MMT:  MMT Right eval Left eval  Shoulder flexion 4+ 4  Shoulder extension    Shoulder abduction 4 4  Shoulder adduction    Shoulder extension    Shoulder internal rotation 4+ 4+  Shoulder external rotation 4 4+  Middle trapezius 4-   Lower trapezius 4   Elbow flexion 4 4  Elbow extension 5 5  Wrist flexion    Wrist extension 4+ 4+  Wrist ulnar deviation    Wrist radial deviation    Wrist pronation    Wrist supination    Grip strength     (Blank rows = not tested)  CERVICAL SPECIAL TESTS:  Spurling's test: Negative.  Increased R pectoralis symptoms with cervical distraction  (+) Empty can R shoulder   FUNCTIONAL TESTS:    TREATMENT DATE: 04/02/2024                                                                                                                                Manual therapy   TTP to mid clavicle at area of old fracture.   Seated STM R anterior lateral and  posterior shoulder to decrease fascial restrictions  Seated caudal glide R first rib grade 3 to promote mobility   Seated STM R posterior lateral neck to improve fascial mobility   Seated STM R levator scapula to decrease muscle tension (reproduced R axilla symptoms)   Seated STM to muscles to the R of C6 area. Reproduced R axillary symptoms   Seated L to R transverse glide to C6 spinous process grade 3-  Seated gentle A to P sustaind pressure to R humeral head. Increased symptoms.    Improved exercise technique, movement at target joints, use of target muscles after mod verbal, visual, tactile cues.  Neuromuscular re education  Seated chin tuck 10x5 seconds    Then with L and R cervical rotation 10x each direction   Manual caudal pressure to R first rib area  With R and L cervical rotation 8x each direction   Increased R axilla and medial forearm symptoms   Reviewed POC: continue for a few more sessions, if no improvement, pt might have to be referred back to his referring provider.   Improved exercise technique, movement at target joints, use of target muscles after mod verbal, visual, tactile cues.    PATIENT EDUCATION:  Education details: there-ex, HEP, POC Person educated: Patient Education method: Explanation, Demonstration, Tactile cues, Verbal cues, and Handouts Education comprehension: verbalized understanding and returned demonstration  HOME EXERCISE PROGRAM: Access Code: [email or web address] URL: [email or web address] Date: 03/01/2024 Prepared by: Emil Glassman  Exercises - Seated Cervical Retraction  - 3 x daily - 7 x weekly - 3 sets - 10 reps - 5 seconds hold   ASSESSMENT:  CLINICAL IMPRESSION:   Break of about 7 minutes for medical emergency assistance in another room.   Worked on improving fascial mobility near clavicle, as well as decreasing muscle tension to R posterior lateral neck, and improving R first rib mobility. Reproduction of R  axillary pain with palpation to R mid clavicle shaft at area of previous fx, as well as R levator scapula area, R cervical paraspinal muscles around C6 and with cervical rotation movements.  No improvement of pain after treatment. Reviewed POC: continue for a few more sessions, if no improvement, pt might have to be referred back to his referring provider.  Pt will benefit from continued skilled physical therapy services to decrease pain, improve strength and function.      OBJECTIVE IMPAIRMENTS: decreased ROM, decreased strength, improper body mechanics, postural dysfunction, and pain.   ACTIVITY LIMITATIONS: lifting, dressing, reach over head, and hygiene/grooming  PARTICIPATION LIMITATIONS:   PERSONAL FACTORS: Fitness, Profession, Time since onset of injury/illness/exacerbation, and 1-2 comorbidities: anxiety, HTN are also affecting patient's functional outcome.   REHAB POTENTIAL: Fair    CLINICAL DECISION MAKING: Stable/uncomplicated  EVALUATION COMPLEXITY: Low   GOALS: Goals reviewed with patient? Yes  SHORT TERM GOALS: Target date: 03/15/2024  Pt will be independent with his initial HEP to decrease pain, symptoms, improve strength, function, ability to reach and don and doff clothes more comfortably.  Baseline: Pt has started his initial HEP (03/01/2024) Goal status: INITIAL   LONG TERM GOALS: Target date: 04/26/2024  Pt will have a decrease in R pectoralis, axilla, and anterior arm pain/paresthesia to 2/10 or less at worst to promote ability to reach, as well as don and doff clothes more comfortably.  Baseline: R pectoralis, axilla, anterior arm: 10/10 at worst for the past 3-4 months (03/01/2024) Goal status: INITIAL  2.  Pt will improve his NDI score to 2% or less as a demonstration of improved function (03/01/2024) Baseline: 8% (03/01/2024) Goal status: INITIAL  3.  Pt will improve B lower trap and middle trap and R ER strength to promote ability to reach, as well as  don and doff clothes more comfortably.  Baseline:  MMT Right eval Left eval  Shoulder abduction 4 4  Shoulder external rotation 4 4+  Middle trapezius 4-   Lower trapezius 4    Goal status: INITIAL  4.  Pt will improve R shoulder flexion AROM to at least 140 degrees to promote ability to reach more comfortably.  Baseline:  Active ROM Right eval  Shoulder flexion  99 with pain (102 AAROM with stiffness at end range and anterior R shoulder joint pain   Goal status: INITIAL    PLAN:  PT FREQUENCY: 1-2x/week  PT DURATION: 8 weeks  PLANNED INTERVENTIONS: 97110-Therapeutic exercises, 97530- Therapeutic activity, 97112- Neuromuscular re-education, 97535- Self Care, 02859- Manual therapy, [id number]- Electrical stimulation (unattended), [phone]- Traction (mechanical), [id number]- Ionotophoresis 4mg /ml Dexamethasone, 79439 (1-2 muscles), 20561 (3+ muscles)- Dry Needling, Patient/Family education, Joint mobilization, and Spinal mobilization  PLAN FOR NEXT SESSION: Posture, thoracic extension, scapular and ER strengthening, anterior cervical strengthening, manual techniques, modalities PRN   Lauryl Seyer, PT, DPT 04/02/2024, 10:04 AM

## 2024-04-05 ENCOUNTER — Telehealth: Payer: Self-pay

## 2024-04-05 ENCOUNTER — Ambulatory Visit

## 2024-04-05 NOTE — Telephone Encounter (Signed)
 No show. Called patient and left a message pertaining to appointment and a reminder for the next follow up session. Return phone call requested. Phone number [phone]) provided.

## 2024-04-09 ENCOUNTER — Telehealth: Payer: Self-pay

## 2024-04-09 ENCOUNTER — Ambulatory Visit

## 2024-04-09 NOTE — Telephone Encounter (Signed)
 No show. Called patient and left a message pertaining to appointment and a reminder for the next follow up session. Pt was also informed that if he misses the next appointment, we might have to remove him from the schedule due to multiple no shows per policy. Return phone call requested. Phone number [phone]) provided.

## 2024-04-16 ENCOUNTER — Ambulatory Visit

## 2024-04-19 ENCOUNTER — Ambulatory Visit

## 2024-04-22 ENCOUNTER — Ambulatory Visit

## 2024-04-24 ENCOUNTER — Ambulatory Visit

## 2024-04-29 ENCOUNTER — Ambulatory Visit

## 2024-05-01 ENCOUNTER — Ambulatory Visit

## 2024-05-06 ENCOUNTER — Ambulatory Visit

## 2024-05-06 ENCOUNTER — Encounter: Payer: Self-pay | Admitting: Family Medicine

## 2024-05-08 ENCOUNTER — Ambulatory Visit

## 2024-05-09 ENCOUNTER — Other Ambulatory Visit: Payer: Self-pay | Admitting: Family Medicine

## 2024-05-09 DIAGNOSIS — F411 Generalized anxiety disorder: Secondary | ICD-10-CM

## 2024-06-10 ENCOUNTER — Encounter: Admitting: Family Medicine
